# Patient Record
Sex: Female | Born: 1945 | Race: White | Hispanic: No | Marital: Single | Smoking: Former smoker
Health system: Southern US, Community
[De-identification: ages and names within clinical notes are randomized; demographics above are authoritative.]

## PROBLEM LIST (undated history)

## (undated) DIAGNOSIS — R600 Localized edema: Secondary | ICD-10-CM

## (undated) DIAGNOSIS — I10 Essential (primary) hypertension: Secondary | ICD-10-CM

## (undated) DIAGNOSIS — M2550 Pain in unspecified joint: Secondary | ICD-10-CM

## (undated) DIAGNOSIS — E785 Hyperlipidemia, unspecified: Secondary | ICD-10-CM

## (undated) DIAGNOSIS — E039 Hypothyroidism, unspecified: Secondary | ICD-10-CM

## (undated) DIAGNOSIS — IMO0002 Reserved for concepts with insufficient information to code with codable children: Secondary | ICD-10-CM

## (undated) DIAGNOSIS — D649 Anemia, unspecified: Secondary | ICD-10-CM

## (undated) DIAGNOSIS — E1165 Type 2 diabetes mellitus with hyperglycemia: Secondary | ICD-10-CM

## (undated) DIAGNOSIS — G5700 Lesion of sciatic nerve, unspecified lower limb: Secondary | ICD-10-CM

## (undated) DIAGNOSIS — R0602 Shortness of breath: Secondary | ICD-10-CM

## (undated) DIAGNOSIS — C9110 Chronic lymphocytic leukemia of B-cell type not having achieved remission: Secondary | ICD-10-CM

## (undated) DIAGNOSIS — E559 Vitamin D deficiency, unspecified: Secondary | ICD-10-CM

## (undated) DIAGNOSIS — M4306 Spondylolysis, lumbar region: Secondary | ICD-10-CM

## (undated) DIAGNOSIS — M549 Dorsalgia, unspecified: Secondary | ICD-10-CM

## (undated) DIAGNOSIS — E669 Obesity, unspecified: Secondary | ICD-10-CM

## (undated) HISTORY — DX: Vitamin D deficiency, unspecified: E55.9

## (undated) HISTORY — DX: Anemia, unspecified: D64.9

## (undated) HISTORY — DX: Localized edema: R60.0

## (undated) HISTORY — PX: KNEE SURGERY: SHX244

## (undated) HISTORY — DX: Lesion of sciatic nerve, unspecified lower limb: G57.00

## (undated) HISTORY — DX: Reserved for concepts with insufficient information to code with codable children: IMO0002

## (undated) HISTORY — PX: TONSILLECTOMY AND ADENOIDECTOMY: SUR1326

## (undated) HISTORY — DX: Essential (primary) hypertension: I10

## (undated) HISTORY — DX: Dorsalgia, unspecified: M54.9

## (undated) HISTORY — DX: Pain in unspecified joint: M25.50

## (undated) HISTORY — DX: Shortness of breath: R06.02

## (undated) HISTORY — DX: Chronic lymphocytic leukemia of B-cell type not having achieved remission: C91.10

## (undated) HISTORY — DX: Spondylolysis, lumbar region: M43.06

## (undated) HISTORY — PX: ELBOW SURGERY: SHX618

## (undated) HISTORY — DX: Obesity, unspecified: E66.9

## (undated) HISTORY — DX: Hypothyroidism, unspecified: E03.9

## (undated) HISTORY — DX: Hyperlipidemia, unspecified: E78.5

## (undated) HISTORY — DX: Type 2 diabetes mellitus with hyperglycemia: E11.65

---

## 1998-02-24 ENCOUNTER — Other Ambulatory Visit: Admission: RE | Admit: 1998-02-24 | Discharge: 1998-02-24 | Payer: Self-pay | Admitting: Obstetrics and Gynecology

## 1998-03-02 ENCOUNTER — Other Ambulatory Visit: Admission: RE | Admit: 1998-03-02 | Discharge: 1998-03-02 | Payer: Self-pay | Admitting: Obstetrics and Gynecology

## 1999-05-20 ENCOUNTER — Other Ambulatory Visit: Admission: RE | Admit: 1999-05-20 | Discharge: 1999-05-20 | Payer: Self-pay | Admitting: Gynecology

## 2000-07-27 ENCOUNTER — Other Ambulatory Visit: Admission: RE | Admit: 2000-07-27 | Discharge: 2000-07-27 | Payer: Self-pay | Admitting: Gynecology

## 2001-09-10 ENCOUNTER — Other Ambulatory Visit: Admission: RE | Admit: 2001-09-10 | Discharge: 2001-09-10 | Payer: Self-pay | Admitting: Gynecology

## 2002-01-03 ENCOUNTER — Other Ambulatory Visit: Admission: RE | Admit: 2002-01-03 | Discharge: 2002-01-03 | Payer: Self-pay | Admitting: Obstetrics and Gynecology

## 2002-01-10 ENCOUNTER — Emergency Department (HOSPITAL_COMMUNITY): Admission: EM | Admit: 2002-01-10 | Discharge: 2002-01-10 | Payer: Self-pay

## 2002-07-16 ENCOUNTER — Encounter: Admission: RE | Admit: 2002-07-16 | Discharge: 2002-10-14 | Payer: Self-pay | Admitting: Family Medicine

## 2003-04-30 ENCOUNTER — Other Ambulatory Visit: Admission: RE | Admit: 2003-04-30 | Discharge: 2003-04-30 | Payer: Self-pay | Admitting: Gynecology

## 2013-07-07 ENCOUNTER — Other Ambulatory Visit (HOSPITAL_COMMUNITY)
Admission: RE | Admit: 2013-07-07 | Discharge: 2013-07-07 | Disposition: A | Discharge status: Home / Self Care | Payer: 59 | Source: Ambulatory Visit | Attending: Family Medicine | Admitting: Family Medicine

## 2013-07-07 ENCOUNTER — Other Ambulatory Visit: Payer: Self-pay | Admitting: Family Medicine

## 2013-07-07 DIAGNOSIS — Z01419 Encounter for gynecological examination (general) (routine) without abnormal findings: Secondary | ICD-10-CM | POA: Insufficient documentation

## 2014-08-13 ENCOUNTER — Other Ambulatory Visit: Payer: Self-pay | Admitting: Gastroenterology

## 2015-01-20 DIAGNOSIS — N63 Unspecified lump in breast: Secondary | ICD-10-CM | POA: Diagnosis not present

## 2015-05-11 DIAGNOSIS — I10 Essential (primary) hypertension: Secondary | ICD-10-CM | POA: Diagnosis not present

## 2015-05-11 DIAGNOSIS — E1165 Type 2 diabetes mellitus with hyperglycemia: Secondary | ICD-10-CM | POA: Diagnosis not present

## 2015-05-11 DIAGNOSIS — Z794 Long term (current) use of insulin: Secondary | ICD-10-CM | POA: Diagnosis not present

## 2015-05-11 DIAGNOSIS — E559 Vitamin D deficiency, unspecified: Secondary | ICD-10-CM | POA: Diagnosis not present

## 2015-05-11 DIAGNOSIS — E782 Mixed hyperlipidemia: Secondary | ICD-10-CM | POA: Diagnosis not present

## 2015-05-14 DIAGNOSIS — L719 Rosacea, unspecified: Secondary | ICD-10-CM | POA: Diagnosis not present

## 2015-05-14 DIAGNOSIS — J309 Allergic rhinitis, unspecified: Secondary | ICD-10-CM | POA: Diagnosis not present

## 2015-05-14 DIAGNOSIS — E559 Vitamin D deficiency, unspecified: Secondary | ICD-10-CM | POA: Diagnosis not present

## 2015-05-14 DIAGNOSIS — E782 Mixed hyperlipidemia: Secondary | ICD-10-CM | POA: Diagnosis not present

## 2015-05-14 DIAGNOSIS — D649 Anemia, unspecified: Secondary | ICD-10-CM | POA: Diagnosis not present

## 2015-05-14 DIAGNOSIS — G479 Sleep disorder, unspecified: Secondary | ICD-10-CM | POA: Diagnosis not present

## 2015-05-14 DIAGNOSIS — M5431 Sciatica, right side: Secondary | ICD-10-CM | POA: Diagnosis not present

## 2015-05-14 DIAGNOSIS — I10 Essential (primary) hypertension: Secondary | ICD-10-CM | POA: Diagnosis not present

## 2015-05-14 DIAGNOSIS — E1165 Type 2 diabetes mellitus with hyperglycemia: Secondary | ICD-10-CM | POA: Diagnosis not present

## 2016-05-26 DIAGNOSIS — D649 Anemia, unspecified: Secondary | ICD-10-CM | POA: Diagnosis not present

## 2016-05-26 DIAGNOSIS — E782 Mixed hyperlipidemia: Secondary | ICD-10-CM | POA: Diagnosis not present

## 2016-05-26 DIAGNOSIS — Z794 Long term (current) use of insulin: Secondary | ICD-10-CM | POA: Diagnosis not present

## 2016-05-26 DIAGNOSIS — I10 Essential (primary) hypertension: Secondary | ICD-10-CM | POA: Diagnosis not present

## 2016-05-26 DIAGNOSIS — E559 Vitamin D deficiency, unspecified: Secondary | ICD-10-CM | POA: Diagnosis not present

## 2016-05-26 DIAGNOSIS — E1165 Type 2 diabetes mellitus with hyperglycemia: Secondary | ICD-10-CM | POA: Diagnosis not present

## 2016-05-26 DIAGNOSIS — Z7984 Long term (current) use of oral hypoglycemic drugs: Secondary | ICD-10-CM | POA: Diagnosis not present

## 2016-05-31 DIAGNOSIS — I10 Essential (primary) hypertension: Secondary | ICD-10-CM | POA: Diagnosis not present

## 2016-05-31 DIAGNOSIS — M542 Cervicalgia: Secondary | ICD-10-CM | POA: Diagnosis not present

## 2016-05-31 DIAGNOSIS — R0609 Other forms of dyspnea: Secondary | ICD-10-CM | POA: Diagnosis not present

## 2016-05-31 DIAGNOSIS — Z6838 Body mass index (BMI) 38.0-38.9, adult: Secondary | ICD-10-CM | POA: Diagnosis not present

## 2016-05-31 DIAGNOSIS — R000 Tachycardia, unspecified: Secondary | ICD-10-CM | POA: Diagnosis not present

## 2016-05-31 DIAGNOSIS — E782 Mixed hyperlipidemia: Secondary | ICD-10-CM | POA: Diagnosis not present

## 2016-05-31 DIAGNOSIS — Z794 Long term (current) use of insulin: Secondary | ICD-10-CM | POA: Diagnosis not present

## 2016-05-31 DIAGNOSIS — E1165 Type 2 diabetes mellitus with hyperglycemia: Secondary | ICD-10-CM | POA: Diagnosis not present

## 2016-05-31 DIAGNOSIS — Z7984 Long term (current) use of oral hypoglycemic drugs: Secondary | ICD-10-CM | POA: Diagnosis not present

## 2016-06-16 ENCOUNTER — Encounter: Payer: Self-pay | Admitting: Cardiology

## 2016-06-21 ENCOUNTER — Encounter: Payer: Self-pay | Admitting: Cardiology

## 2016-06-27 DIAGNOSIS — M5412 Radiculopathy, cervical region: Secondary | ICD-10-CM | POA: Diagnosis not present

## 2016-06-30 ENCOUNTER — Encounter: Payer: Self-pay | Admitting: Physician Assistant

## 2016-07-12 DIAGNOSIS — M5412 Radiculopathy, cervical region: Secondary | ICD-10-CM | POA: Diagnosis not present

## 2016-07-14 ENCOUNTER — Ambulatory Visit: Payer: Self-pay | Admitting: Cardiology

## 2016-07-18 ENCOUNTER — Ambulatory Visit: Payer: Self-pay | Admitting: Physician Assistant

## 2016-08-02 ENCOUNTER — Ambulatory Visit (INDEPENDENT_AMBULATORY_CARE_PROVIDER_SITE_OTHER): Payer: Medicare Other | Admitting: Cardiovascular Disease

## 2016-08-02 ENCOUNTER — Encounter: Payer: Self-pay | Admitting: Cardiovascular Disease

## 2016-08-02 ENCOUNTER — Encounter (INDEPENDENT_AMBULATORY_CARE_PROVIDER_SITE_OTHER): Payer: Self-pay

## 2016-08-02 VITALS — BP 138/90 | HR 101 | Ht 63.5 in | Wt 223.2 lb

## 2016-08-02 DIAGNOSIS — R000 Tachycardia, unspecified: Secondary | ICD-10-CM | POA: Diagnosis not present

## 2016-08-02 DIAGNOSIS — E785 Hyperlipidemia, unspecified: Secondary | ICD-10-CM | POA: Diagnosis not present

## 2016-08-02 DIAGNOSIS — R0609 Other forms of dyspnea: Secondary | ICD-10-CM

## 2016-08-02 DIAGNOSIS — R0600 Dyspnea, unspecified: Secondary | ICD-10-CM

## 2016-08-02 MED ORDER — CARVEDILOL 3.125 MG PO TABS: 3.1250 mg | ORAL_TABLET | Freq: Two times a day (BID) | ORAL | 11 refills | Status: DC

## 2016-08-02 NOTE — Progress Notes (Signed)
Cardiology Office Note   Date:  08/02/2016   ID:  Cassandra Palmer, DOB 11-25-1945, MRN [id number]  PCP:  Harle Battiest, MD  Cardiologist:   Mertie Moores, MD   Chief Complaint  Patient presents with  . Shortness of Breath  . Tachycardia   Problem List 1. Dyspnea with exertion 2. Tachycardia  3 hyperlipidemia  4. Obstructive  sleep apnea  5. Diabetes Mellitus   History of Present Illness: Cassandra Palmer is a 70 y.o. female who presents for evaluation of dyspnea and tachycardia    Has noticed DOE for the past 6 - 12 months .   Gets short of breath doing her regular household chores.  No DOE with her water aerobic.   No CP or tightness.   Never has doe at rest .  Has also noticed that her HR is too high. Cant feel paliptations but notices thather HR is high when she takes her BP readings  Has been on Lisinopril  Has not been tried on beta blocker   Diabetes is poorly controlled - HbA1C was 9 this year Was put on Invocana  Admits that she does not follow a good diet, has been to nutritionist.    Does water aerobics , is able to walk some but has limited walking ability due to cysts in her back.   Non smoker, ETOH rarely  Retired as a Tree surgeon for hair care products.     Past Medical History:  Diagnosis Date  . Diabetes mellitus type 2, uncontrolled ([place])   . Hyperlipidemia   . Hypertension     Past Surgical History:  Procedure Laterality Date  . ELBOW SURGERY    . KNEE SURGERY       Current Outpatient Prescriptions  Medication Sig Dispense Refill  . aspirin EC 81 MG tablet Take 81 mg by mouth daily.    . canagliflozin (INVOKANA) 300 MG TABS tablet Take 300 mg by mouth daily before breakfast.    . celecoxib (CELEBREX) 200 MG capsule Take 200 mg by mouth daily.    . cyclobenzaprine (FLEXERIL) 10 MG tablet Take 10 mg by mouth daily as needed for muscle spasms.    Marland Kitchen ezetimibe (ZETIA) 10 MG tablet Take 10 mg by mouth daily.    . metFORMIN  (GLUCOPHAGE) 500 MG tablet Take 500 mg by mouth 4 (four) times daily.    . metroNIDAZOLE (METROCREAM) 0.75 % cream Apply 1 application topically as directed.    . niacin (NIASPAN) 1000 MG CR tablet Take 2,000 mg by mouth at bedtime.    . simvastatin (ZOCOR) 40 MG tablet Take 40 mg by mouth daily.    . traZODone (DESYREL) 100 MG tablet Take 100 mg by mouth at bedtime as needed for sleep.    . carvedilol (COREG) 3.125 MG tablet Take 1 tablet (3.125 mg total) by mouth 2 (two) times daily. 60 tablet 11   No current facility-administered medications for this visit.     Allergies:   Actos [pioglitazone]    Social History:  The patient  reports that she has quit smoking. She has never used smokeless tobacco. She reports that she drinks about 0.6 - 1.2 oz of alcohol per week . She reports that she does not use drugs.   Family History:  The patient's family history includes Congestive Heart Failure in her mother; Diabetes in her father; Heart Problems in her father; Hypertension in her mother.    ROS:  Please see the history of present illness.  Review of Systems: Constitutional:  denies fever, chills, diaphoresis, appetite change and fatigue.  HEENT: denies photophobia, eye pain, redness, hearing loss, ear pain, congestion, sore throat, rhinorrhea, sneezing, neck pain, neck stiffness and tinnitus.  Respiratory: denies SOB, DOE,  Cardiovascular: denies chest pain, palpitations and leg swelling.  Gastrointestinal: denies nausea, vomiting, abdominal pain, diarrhea, constipation, blood in stool.  Genitourinary: denies dysuria, urgency, frequency, hematuria, flank pain and difficulty urinating.  Musculoskeletal: denies  myalgias, back pain, joint swelling, arthralgias and gait problem.   Skin: denies pallor, rash and wound.  Neurological: denies dizziness, seizures, syncope, weakness, light-headedness, numbness and headaches.   Hematological: denies adenopathy, easy bruising, personal or family  bleeding history.  Psychiatric/ Behavioral: denies suicidal ideation, mood changes, confusion, nervousness, sleep disturbance and agitation.       All other systems are reviewed and negative.    PHYSICAL EXAM: VS:  BP 138/90   Pulse (!) 101   Ht 5' 3.5" (1.613 m)   Wt 223 lb 3.2 oz (101.2 kg)   BMI 38.92 kg/m  , BMI Body mass index is 38.92 kg/m. GEN: Well nourished, well developed, in no acute distress  HEENT: normal  Neck: no JVD, carotid bruits, or masses Cardiac: RRR; no murmurs, rubs, or gallops,no edema  Respiratory:  clear to auscultation bilaterally, normal work of breathing GI: soft, nontender, nondistended, + BS MS: no deformity or atrophy  Skin: warm and dry, no rash Neuro:  Strength and sensation are intact Psych: normal   EKG:  EKG is ordered today. The ekg ordered today demonstrates sinus tach at 101.  Poor R wave progression    Recent Labs: No results found for requested labs within last 8760 hours.    Lipid Panel No results found for: CHOL, TRIG, HDL, CHOLHDL, VLDL, LDLCALC, LDLDIRECT    Wt Readings from Last 3 Encounters:  08/02/16 223 lb 3.2 oz (101.2 kg)      Other studies Reviewed: Additional studies/ records that were reviewed today include: . Review of the above records demonstrates:    ASSESSMENT AND PLAN:  1.  Dyspnea:   The patient presents with shortness of breath with exertion. She does not get a lot of exercise. We'll get an echo card gram for further evaluation of her LV function.  She works out in [employer] most of the time because she has chronic back pain. I've encouraged her to try to an exercise bike I've encouraged her to continue with her weight loss program.  2. Tachycardia: We'll stop the lisinopril. We'll start her on carvedilol 3.125 mg twice a day. We'll get the echocardiogram as noted above. I'll see her back in 3 months.  Current medicines are reviewed at length with the patient today.  The patient does not have  concerns regarding medicines.  Labs/ tests ordered today include:   Orders Placed This Encounter  Procedures  . EKG 12-Lead  . ECHOCARDIOGRAM COMPLETE     Disposition:   FU with me in 3 months      Mertie Moores, MD  08/02/2016 6:15 PM    [facility] [address], [place], [place]  [address] Phone: [phone]; Fax: [phone]

## 2016-08-02 NOTE — Patient Instructions (Signed)
Medication Instructions:  STOP Lisinopril START Carvedilol (Coreg) 3.125 mg twice daily - take 12 hours apart   Labwork: None Ordered   Testing/Procedures: Your physician has requested that you have an echocardiogram. Echocardiography is a painless test that uses sound waves to create images of your heart. It provides your doctor with information about the size and shape of your heart and how well your heart's chambers and valves are working. This procedure takes approximately one hour. There are no restrictions for this procedure.   Follow-Up: Your physician recommends that you schedule a follow-up appointment in: 3 months with Dr. Acie Fredrickson.    If you need a refill on your cardiac medications before your next appointment, please call your pharmacy.   Thank you for choosing [facility]! Christen Bame, RN [phone]

## 2016-08-10 DIAGNOSIS — N6320 Unspecified lump in the left breast, unspecified quadrant: Secondary | ICD-10-CM | POA: Diagnosis not present

## 2016-08-16 ENCOUNTER — Ambulatory Visit (HOSPITAL_COMMUNITY): Payer: Medicare Other | Attending: Cardiology

## 2016-08-16 ENCOUNTER — Other Ambulatory Visit: Payer: Self-pay

## 2016-08-16 DIAGNOSIS — R0609 Other forms of dyspnea: Secondary | ICD-10-CM | POA: Diagnosis not present

## 2016-08-16 DIAGNOSIS — I501 Left ventricular failure: Secondary | ICD-10-CM | POA: Diagnosis not present

## 2016-08-16 DIAGNOSIS — R000 Tachycardia, unspecified: Secondary | ICD-10-CM | POA: Diagnosis not present

## 2016-08-16 DIAGNOSIS — R0600 Dyspnea, unspecified: Secondary | ICD-10-CM

## 2016-08-17 DIAGNOSIS — E1165 Type 2 diabetes mellitus with hyperglycemia: Secondary | ICD-10-CM | POA: Diagnosis not present

## 2016-08-17 DIAGNOSIS — Z7984 Long term (current) use of oral hypoglycemic drugs: Secondary | ICD-10-CM | POA: Diagnosis not present

## 2016-08-17 DIAGNOSIS — Z23 Encounter for immunization: Secondary | ICD-10-CM | POA: Diagnosis not present

## 2016-08-17 DIAGNOSIS — E782 Mixed hyperlipidemia: Secondary | ICD-10-CM | POA: Diagnosis not present

## 2016-10-13 ENCOUNTER — Encounter: Payer: Self-pay | Admitting: Cardiovascular Disease

## 2016-10-13 ENCOUNTER — Ambulatory Visit (INDEPENDENT_AMBULATORY_CARE_PROVIDER_SITE_OTHER): Payer: Medicare Other | Admitting: Cardiovascular Disease

## 2016-10-13 VITALS — BP 122/80 | HR 84 | Ht 63.5 in | Wt 227.0 lb

## 2016-10-13 DIAGNOSIS — R000 Tachycardia, unspecified: Secondary | ICD-10-CM | POA: Diagnosis not present

## 2016-10-13 MED ORDER — CARVEDILOL 3.125 MG PO TABS: 3.1250 mg | ORAL_TABLET | Freq: Two times a day (BID) | ORAL | 3 refills | Status: AC

## 2016-10-13 NOTE — Progress Notes (Signed)
Cardiology Office Note   Date:  10/13/2016   ID:  Cassandra Palmer, DOB [birth year], MRN [id number]  PCP:  Melinda Crutch, MD  Cardiologist:   Mertie Moores, MD   Chief Complaint  Patient presents with  . Follow-up    dyspnea    Problem List 1. Dyspnea with exertion 2. Tachycardia  3 hyperlipidemia  4. Obstructive  sleep apnea  5. Diabetes Mellitus   History of Present Illness: Cassandra Palmer is a 70 y.o. female who presents for evaluation of dyspnea and tachycardia    Has noticed DOE for the past 6 - 12 months .   Gets short of breath doing her regular household chores.  No DOE with her water aerobic.   No CP or tightness.   Never has doe at rest .  Has also noticed that her HR is too high. Cant feel paliptations but notices thather HR is high when she takes her BP readings  Has been on Lisinopril  Has not been tried on beta blocker   Diabetes is poorly controlled - HbA1C was 9 this year Was put on Invocana  Admits that she does not follow a good diet, has been to nutritionist.    Does water aerobics , is able to walk some but has limited walking ability due to cysts in her back.   Non smoker, ETOH rarely  Retired as a Tree surgeon for hair care products.   Dec. 15, 2017:  Cassandra Palmer is seen today for follow-up of her tachycardia. Does water aerobic 6 days a week.   Past Medical History:  Diagnosis Date  . Diabetes mellitus type 2, uncontrolled ([place])   . Hyperlipidemia   . Hypertension     Past Surgical History:  Procedure Laterality Date  . ELBOW SURGERY    . KNEE SURGERY       Current Outpatient Prescriptions  Medication Sig Dispense Refill  . aspirin EC 81 MG tablet Take 81 mg by mouth daily.    . canagliflozin (INVOKANA) 300 MG TABS tablet Take 300 mg by mouth daily before breakfast.    . carvedilol (COREG) 3.125 MG tablet Take 1 tablet (3.125 mg total) by mouth 2 (two) times daily. 60 tablet 11  . celecoxib (CELEBREX) 200 MG capsule  Take 200 mg by mouth daily.    . cyclobenzaprine (FLEXERIL) 10 MG tablet Take 10 mg by mouth daily as needed for muscle spasms.    Marland Kitchen ezetimibe (ZETIA) 10 MG tablet Take 10 mg by mouth daily.    . metFORMIN (GLUCOPHAGE) 500 MG tablet Take 500 mg by mouth 4 (four) times daily.    . metroNIDAZOLE (METROCREAM) 0.75 % cream Apply 1 application topically as directed.    . niacin (NIASPAN) 1000 MG CR tablet Take 2,000 mg by mouth at bedtime.    . simvastatin (ZOCOR) 40 MG tablet Take 40 mg by mouth daily.    . traZODone (DESYREL) 100 MG tablet Take 100 mg by mouth at bedtime as needed for sleep.     No current facility-administered medications for this visit.     Allergies:   Actos [pioglitazone] and Other    Social History:  The patient  reports that she has quit smoking. She has never used smokeless tobacco. She reports that she drinks about 0.6 - 1.2 oz of alcohol per week . She reports that she does not use drugs.   Family History:  The patient's family history includes Congestive Heart Failure in her mother; Diabetes in her  father; Heart Problems in her father; Hypertension in her mother.    ROS:  Please see the history of present illness.    Review of Systems: Constitutional:  denies fever, chills, diaphoresis, appetite change and fatigue.  HEENT: denies photophobia, eye pain, redness, hearing loss, ear pain, congestion, sore throat, rhinorrhea, sneezing, neck pain, neck stiffness and tinnitus.  Respiratory: denies SOB, DOE,  Cardiovascular: denies chest pain, palpitations and leg swelling.  Gastrointestinal: denies nausea, vomiting, abdominal pain, diarrhea, constipation, blood in stool.  Genitourinary: denies dysuria, urgency, frequency, hematuria, flank pain and difficulty urinating.  Musculoskeletal: denies  myalgias, back pain, joint swelling, arthralgias and gait problem.   Skin: denies pallor, rash and wound.  Neurological: denies dizziness, seizures, syncope, weakness,  light-headedness, numbness and headaches.   Hematological: denies adenopathy, easy bruising, personal or family bleeding history.  Psychiatric/ Behavioral: denies suicidal ideation, mood changes, confusion, nervousness, sleep disturbance and agitation.       All other systems are reviewed and negative.    PHYSICAL EXAM: VS:  BP 122/80 (BP Location: Left Arm, Patient Position: Sitting, Cuff Size: Normal)   Pulse 84   Ht 5' 3.5" (1.613 m)   Wt 227 lb (103 kg)   BMI 39.58 kg/m  , BMI Body mass index is 39.58 kg/m. GEN: Well nourished, well developed, in no acute distress  HEENT: normal  Neck: no JVD, carotid bruits, or masses Cardiac: RRR; no murmurs, rubs, or gallops,no edema  Respiratory:  clear to auscultation bilaterally, normal work of breathing GI: soft, nontender, nondistended, + BS MS: no deformity or atrophy  Skin: warm and dry, no rash Neuro:  Strength and sensation are intact Psych: normal   EKG:  EKG is ordered today. The ekg ordered today demonstrates sinus tach at 101.  Poor R wave progression    Recent Labs: No results found for requested labs within last 8760 hours.    Lipid Panel No results found for: CHOL, TRIG, HDL, CHOLHDL, VLDL, LDLCALC, LDLDIRECT    Wt Readings from Last 3 Encounters:  10/13/16 227 lb (103 kg)  08/02/16 223 lb 3.2 oz (101.2 kg)      Other studies Reviewed: Additional studies/ records that were reviewed today include: . Review of the above records demonstrates:    ASSESSMENT AND PLAN:  1.  Dyspnea:   The patient presents with shortness of breath with exertion. She does not get a lot of exercise. We'll get an echocardiogram for further evaluation of her LV function.  She works out in [employer] most of the time because she has chronic back pain. I've encouraged her to try to an exercise bike I've encouraged her to continue with her weight loss program.  2. Tachycardia: She is doing much better on the carvedilol and off the  lisinopril. Continue current medications.  Current medicines are reviewed at length with the patient today.  The patient does not have concerns regarding medicines.  Labs/ tests ordered today include:   No orders of the defined types were placed in this encounter.   Disposition:   FU with me as needed.    Mertie Moores, MD  10/13/2016 11:32 AM    [facility] [address], [place], [place]  [address] Phone: [phone]; Fax: [phone]

## 2016-10-13 NOTE — Patient Instructions (Signed)
Medication Instructions:  Your physician recommends that you continue on your current medications as directed. Please refer to the Current Medication list given to you today.   Labwork: None Ordered   Testing/Procedures: None Ordered   Follow-Up: Your physician recommends that you schedule a follow-up appointment in: as needed with Dr. Nahser   If you need a refill on your cardiac medications before your next appointment, please call your pharmacy.   Thank you for choosing [facility]! [place], RN [phone]    

## 2016-11-10 ENCOUNTER — Ambulatory Visit: Payer: Medicare Other | Admitting: Cardiovascular Disease

## 2016-11-29 DIAGNOSIS — E1165 Type 2 diabetes mellitus with hyperglycemia: Secondary | ICD-10-CM | POA: Diagnosis not present

## 2016-11-29 DIAGNOSIS — Z794 Long term (current) use of insulin: Secondary | ICD-10-CM | POA: Diagnosis not present

## 2016-12-04 DIAGNOSIS — L719 Rosacea, unspecified: Secondary | ICD-10-CM | POA: Diagnosis not present

## 2016-12-04 DIAGNOSIS — M6740 Ganglion, unspecified site: Secondary | ICD-10-CM | POA: Diagnosis not present

## 2016-12-04 DIAGNOSIS — B379 Candidiasis, unspecified: Secondary | ICD-10-CM | POA: Diagnosis not present

## 2016-12-04 DIAGNOSIS — Z794 Long term (current) use of insulin: Secondary | ICD-10-CM | POA: Diagnosis not present

## 2016-12-04 DIAGNOSIS — Z7984 Long term (current) use of oral hypoglycemic drugs: Secondary | ICD-10-CM | POA: Diagnosis not present

## 2016-12-04 DIAGNOSIS — E1165 Type 2 diabetes mellitus with hyperglycemia: Secondary | ICD-10-CM | POA: Diagnosis not present

## 2016-12-04 DIAGNOSIS — G4700 Insomnia, unspecified: Secondary | ICD-10-CM | POA: Diagnosis not present

## 2016-12-04 DIAGNOSIS — E782 Mixed hyperlipidemia: Secondary | ICD-10-CM | POA: Diagnosis not present

## 2016-12-18 DIAGNOSIS — R69 Illness, unspecified: Secondary | ICD-10-CM | POA: Diagnosis not present

## 2017-01-10 DIAGNOSIS — R69 Illness, unspecified: Secondary | ICD-10-CM | POA: Diagnosis not present

## 2017-01-15 DIAGNOSIS — R69 Illness, unspecified: Secondary | ICD-10-CM | POA: Diagnosis not present

## 2017-02-19 DIAGNOSIS — R69 Illness, unspecified: Secondary | ICD-10-CM | POA: Diagnosis not present

## 2017-03-02 DIAGNOSIS — B379 Candidiasis, unspecified: Secondary | ICD-10-CM | POA: Diagnosis not present

## 2017-03-02 DIAGNOSIS — E1165 Type 2 diabetes mellitus with hyperglycemia: Secondary | ICD-10-CM | POA: Diagnosis not present

## 2017-03-02 DIAGNOSIS — E782 Mixed hyperlipidemia: Secondary | ICD-10-CM | POA: Diagnosis not present

## 2017-03-02 DIAGNOSIS — Z7984 Long term (current) use of oral hypoglycemic drugs: Secondary | ICD-10-CM | POA: Diagnosis not present

## 2017-03-02 DIAGNOSIS — Z794 Long term (current) use of insulin: Secondary | ICD-10-CM | POA: Diagnosis not present

## 2017-03-07 DIAGNOSIS — I10 Essential (primary) hypertension: Secondary | ICD-10-CM | POA: Diagnosis not present

## 2017-03-07 DIAGNOSIS — G4700 Insomnia, unspecified: Secondary | ICD-10-CM | POA: Diagnosis not present

## 2017-03-07 DIAGNOSIS — E1165 Type 2 diabetes mellitus with hyperglycemia: Secondary | ICD-10-CM | POA: Diagnosis not present

## 2017-03-07 DIAGNOSIS — E782 Mixed hyperlipidemia: Secondary | ICD-10-CM | POA: Diagnosis not present

## 2017-04-08 DIAGNOSIS — R69 Illness, unspecified: Secondary | ICD-10-CM | POA: Diagnosis not present

## 2017-04-27 DIAGNOSIS — M4316 Spondylolisthesis, lumbar region: Secondary | ICD-10-CM | POA: Diagnosis not present

## 2017-04-30 DIAGNOSIS — H5213 Myopia, bilateral: Secondary | ICD-10-CM | POA: Diagnosis not present

## 2017-04-30 DIAGNOSIS — Z794 Long term (current) use of insulin: Secondary | ICD-10-CM | POA: Diagnosis not present

## 2017-04-30 DIAGNOSIS — H2513 Age-related nuclear cataract, bilateral: Secondary | ICD-10-CM | POA: Diagnosis not present

## 2017-04-30 DIAGNOSIS — H524 Presbyopia: Secondary | ICD-10-CM | POA: Diagnosis not present

## 2017-04-30 DIAGNOSIS — Z0100 Encounter for examination of eyes and vision without abnormal findings: Secondary | ICD-10-CM | POA: Diagnosis not present

## 2017-04-30 DIAGNOSIS — E119 Type 2 diabetes mellitus without complications: Secondary | ICD-10-CM | POA: Diagnosis not present

## 2017-05-30 DIAGNOSIS — N62 Hypertrophy of breast: Secondary | ICD-10-CM | POA: Diagnosis not present

## 2017-06-12 DIAGNOSIS — M79645 Pain in left finger(s): Secondary | ICD-10-CM | POA: Diagnosis not present

## 2017-06-22 DIAGNOSIS — Z7984 Long term (current) use of oral hypoglycemic drugs: Secondary | ICD-10-CM | POA: Diagnosis not present

## 2017-06-22 DIAGNOSIS — D649 Anemia, unspecified: Secondary | ICD-10-CM | POA: Diagnosis not present

## 2017-06-22 DIAGNOSIS — E559 Vitamin D deficiency, unspecified: Secondary | ICD-10-CM | POA: Diagnosis not present

## 2017-06-22 DIAGNOSIS — I10 Essential (primary) hypertension: Secondary | ICD-10-CM | POA: Diagnosis not present

## 2017-06-22 DIAGNOSIS — E782 Mixed hyperlipidemia: Secondary | ICD-10-CM | POA: Diagnosis not present

## 2017-06-22 DIAGNOSIS — E1165 Type 2 diabetes mellitus with hyperglycemia: Secondary | ICD-10-CM | POA: Diagnosis not present

## 2017-06-26 DIAGNOSIS — G4700 Insomnia, unspecified: Secondary | ICD-10-CM | POA: Diagnosis not present

## 2017-06-26 DIAGNOSIS — J309 Allergic rhinitis, unspecified: Secondary | ICD-10-CM | POA: Diagnosis not present

## 2017-06-26 DIAGNOSIS — E559 Vitamin D deficiency, unspecified: Secondary | ICD-10-CM | POA: Diagnosis not present

## 2017-06-26 DIAGNOSIS — D649 Anemia, unspecified: Secondary | ICD-10-CM | POA: Diagnosis not present

## 2017-06-26 DIAGNOSIS — M549 Dorsalgia, unspecified: Secondary | ICD-10-CM | POA: Diagnosis not present

## 2017-06-26 DIAGNOSIS — E782 Mixed hyperlipidemia: Secondary | ICD-10-CM | POA: Diagnosis not present

## 2017-06-26 DIAGNOSIS — Z23 Encounter for immunization: Secondary | ICD-10-CM | POA: Diagnosis not present

## 2017-06-26 DIAGNOSIS — I10 Essential (primary) hypertension: Secondary | ICD-10-CM | POA: Diagnosis not present

## 2017-06-26 DIAGNOSIS — E1165 Type 2 diabetes mellitus with hyperglycemia: Secondary | ICD-10-CM | POA: Diagnosis not present

## 2017-06-26 DIAGNOSIS — Z0000 Encounter for general adult medical examination without abnormal findings: Secondary | ICD-10-CM | POA: Diagnosis not present

## 2017-07-04 ENCOUNTER — Other Ambulatory Visit: Payer: Self-pay | Admitting: Cardiovascular Disease

## 2017-07-05 NOTE — Telephone Encounter (Signed)
Medication Detail    Disp Refills Start End   carvedilol (COREG) 3.125 MG tablet 180 tablet 3 10/13/2016 01/11/2017   Sig - Route: Take 1 tablet (3.125 mg total) by mouth 2 (two) times daily. - Oral   Sent to pharmacy as: carvedilol (COREG) 3.125 MG tablet   E-Prescribing Status: Receipt confirmed by pharmacy (10/13/2016 11:49 AM EST)   Pharmacy   [id number] - [place], [address] AT [address]

## 2017-07-06 DIAGNOSIS — R69 Illness, unspecified: Secondary | ICD-10-CM | POA: Diagnosis not present

## 2017-07-30 DIAGNOSIS — R69 Illness, unspecified: Secondary | ICD-10-CM | POA: Diagnosis not present

## 2017-08-23 DIAGNOSIS — R69 Illness, unspecified: Secondary | ICD-10-CM | POA: Diagnosis not present

## 2017-09-28 DIAGNOSIS — Z1231 Encounter for screening mammogram for malignant neoplasm of breast: Secondary | ICD-10-CM | POA: Diagnosis not present

## 2017-10-15 DIAGNOSIS — R69 Illness, unspecified: Secondary | ICD-10-CM | POA: Diagnosis not present

## 2017-12-27 DIAGNOSIS — L820 Inflamed seborrheic keratosis: Secondary | ICD-10-CM | POA: Diagnosis not present

## 2017-12-27 DIAGNOSIS — G4700 Insomnia, unspecified: Secondary | ICD-10-CM | POA: Diagnosis not present

## 2017-12-27 DIAGNOSIS — D225 Melanocytic nevi of trunk: Secondary | ICD-10-CM | POA: Diagnosis not present

## 2017-12-27 DIAGNOSIS — Z794 Long term (current) use of insulin: Secondary | ICD-10-CM | POA: Diagnosis not present

## 2017-12-27 DIAGNOSIS — Z0000 Encounter for general adult medical examination without abnormal findings: Secondary | ICD-10-CM | POA: Diagnosis not present

## 2017-12-27 DIAGNOSIS — E1165 Type 2 diabetes mellitus with hyperglycemia: Secondary | ICD-10-CM | POA: Diagnosis not present

## 2017-12-27 DIAGNOSIS — E559 Vitamin D deficiency, unspecified: Secondary | ICD-10-CM | POA: Diagnosis not present

## 2017-12-27 DIAGNOSIS — L718 Other rosacea: Secondary | ICD-10-CM | POA: Diagnosis not present

## 2017-12-27 DIAGNOSIS — L821 Other seborrheic keratosis: Secondary | ICD-10-CM | POA: Diagnosis not present

## 2017-12-27 DIAGNOSIS — I10 Essential (primary) hypertension: Secondary | ICD-10-CM | POA: Diagnosis not present

## 2017-12-27 DIAGNOSIS — D649 Anemia, unspecified: Secondary | ICD-10-CM | POA: Diagnosis not present

## 2017-12-27 DIAGNOSIS — E782 Mixed hyperlipidemia: Secondary | ICD-10-CM | POA: Diagnosis not present

## 2018-01-01 DIAGNOSIS — E782 Mixed hyperlipidemia: Secondary | ICD-10-CM | POA: Diagnosis not present

## 2018-01-01 DIAGNOSIS — E1165 Type 2 diabetes mellitus with hyperglycemia: Secondary | ICD-10-CM | POA: Diagnosis not present

## 2018-01-01 DIAGNOSIS — D649 Anemia, unspecified: Secondary | ICD-10-CM | POA: Diagnosis not present

## 2018-01-01 DIAGNOSIS — E559 Vitamin D deficiency, unspecified: Secondary | ICD-10-CM | POA: Diagnosis not present

## 2018-01-01 DIAGNOSIS — I10 Essential (primary) hypertension: Secondary | ICD-10-CM | POA: Diagnosis not present

## 2018-01-01 DIAGNOSIS — Z794 Long term (current) use of insulin: Secondary | ICD-10-CM | POA: Diagnosis not present

## 2018-01-08 DIAGNOSIS — R69 Illness, unspecified: Secondary | ICD-10-CM | POA: Diagnosis not present

## 2018-01-30 DIAGNOSIS — M79672 Pain in left foot: Secondary | ICD-10-CM | POA: Diagnosis not present

## 2018-02-21 DIAGNOSIS — R69 Illness, unspecified: Secondary | ICD-10-CM | POA: Diagnosis not present

## 2018-04-17 DIAGNOSIS — R69 Illness, unspecified: Secondary | ICD-10-CM | POA: Diagnosis not present

## 2018-05-06 DIAGNOSIS — E119 Type 2 diabetes mellitus without complications: Secondary | ICD-10-CM | POA: Diagnosis not present

## 2018-05-06 DIAGNOSIS — H2513 Age-related nuclear cataract, bilateral: Secondary | ICD-10-CM | POA: Diagnosis not present

## 2018-05-06 DIAGNOSIS — Z794 Long term (current) use of insulin: Secondary | ICD-10-CM | POA: Diagnosis not present

## 2018-05-06 DIAGNOSIS — H5213 Myopia, bilateral: Secondary | ICD-10-CM | POA: Diagnosis not present

## 2018-05-06 DIAGNOSIS — H25013 Cortical age-related cataract, bilateral: Secondary | ICD-10-CM | POA: Diagnosis not present

## 2018-05-06 DIAGNOSIS — H52203 Unspecified astigmatism, bilateral: Secondary | ICD-10-CM | POA: Diagnosis not present

## 2018-05-06 DIAGNOSIS — H524 Presbyopia: Secondary | ICD-10-CM | POA: Diagnosis not present

## 2018-05-26 DIAGNOSIS — R69 Illness, unspecified: Secondary | ICD-10-CM | POA: Diagnosis not present

## 2018-07-03 DIAGNOSIS — E1165 Type 2 diabetes mellitus with hyperglycemia: Secondary | ICD-10-CM | POA: Diagnosis not present

## 2018-07-03 DIAGNOSIS — E782 Mixed hyperlipidemia: Secondary | ICD-10-CM | POA: Diagnosis not present

## 2018-07-03 DIAGNOSIS — D649 Anemia, unspecified: Secondary | ICD-10-CM | POA: Diagnosis not present

## 2018-07-03 DIAGNOSIS — I10 Essential (primary) hypertension: Secondary | ICD-10-CM | POA: Diagnosis not present

## 2018-07-03 DIAGNOSIS — E559 Vitamin D deficiency, unspecified: Secondary | ICD-10-CM | POA: Diagnosis not present

## 2018-07-11 DIAGNOSIS — M549 Dorsalgia, unspecified: Secondary | ICD-10-CM | POA: Diagnosis not present

## 2018-07-11 DIAGNOSIS — E782 Mixed hyperlipidemia: Secondary | ICD-10-CM | POA: Diagnosis not present

## 2018-07-11 DIAGNOSIS — R740 Nonspecific elevation of levels of transaminase and lactic acid dehydrogenase [LDH]: Secondary | ICD-10-CM | POA: Diagnosis not present

## 2018-07-11 DIAGNOSIS — E559 Vitamin D deficiency, unspecified: Secondary | ICD-10-CM | POA: Diagnosis not present

## 2018-07-11 DIAGNOSIS — Z0000 Encounter for general adult medical examination without abnormal findings: Secondary | ICD-10-CM | POA: Diagnosis not present

## 2018-07-11 DIAGNOSIS — G4700 Insomnia, unspecified: Secondary | ICD-10-CM | POA: Diagnosis not present

## 2018-07-11 DIAGNOSIS — I10 Essential (primary) hypertension: Secondary | ICD-10-CM | POA: Diagnosis not present

## 2018-07-11 DIAGNOSIS — Z23 Encounter for immunization: Secondary | ICD-10-CM | POA: Diagnosis not present

## 2018-07-11 DIAGNOSIS — E1169 Type 2 diabetes mellitus with other specified complication: Secondary | ICD-10-CM | POA: Diagnosis not present

## 2018-07-11 DIAGNOSIS — J309 Allergic rhinitis, unspecified: Secondary | ICD-10-CM | POA: Diagnosis not present

## 2018-07-17 ENCOUNTER — Encounter: Payer: Self-pay | Admitting: Hematology

## 2018-07-17 ENCOUNTER — Telehealth: Payer: Self-pay | Admitting: Hematology

## 2018-07-17 NOTE — Telephone Encounter (Signed)
New hematology referral received from Dr. Melinda Crutch for elevated wbc. Pt has been scheduled to see Dr. Irene Limbo on 10/7 at 1pm. Pt aware to arrive 30 minutes early. Letter mailed.

## 2018-08-02 NOTE — Progress Notes (Signed)
HEMATOLOGY/ONCOLOGY CONSULTATION NOTE  Date of Service: 08/05/2018  Patient Care Team: Lawerance Cruel, MD as PCP - General (Family Medicine)  CHIEF COMPLAINTS/PURPOSE OF CONSULTATION:  Leukocytosis   HISTORY OF PRESENTING ILLNESS:   Cassandra Palmer is a wonderful 72 y.o. female who has been referred to [place] by Dr. Lawerance Cruel for evaluation and management of Leukocytosis. The pt reports that she is doing well overall. She has had elevated WBC's for the past several years. She notes that she has a hx of elevated leukocytes.  She notes that her blood sugar levels were mildly elevated due to a recent change in her medications due to her insurance not covering these medications. She has been on Metformin for the past 5 years. She has been on Victoza for 1.5 years. She has her labs checked regularly by her PCP. She denies any recent viral infections or flu like symptoms. She denies any recent illnesses within the past 5 years or recent abx use. She has never been on steroids or prednisone. She is UTD with her mammograms and colonoscopy (last one within the last 3 years).   The pt reports associated fatigue, joint stiffness every morning that alleviates with movement, recurrent night sweats at 4-5 AM (clammy sensation). She takes vitamin D and she has had an elevated pulse rate that was evaluated with an echocardiogram with relief of her fatigue. She denies joint pain, abdominal pain, weight loss, mouth sores, and any other symptoms. She denies any other new symptoms in the past 6 months or year. She denies hx of rheumatological symptoms, thyroid issues, blood transfusions, tattoos. She stopped taking Invokana approximately 1-1.5 years ago due to recurrent yeast infections.   Most recent lab results (07/03/18) of CBC w/diff and CMP is as follows: all values are WNL except for WBC at 11.3k, MCV at 80.8, MCH at 26.0, Lymphs abs at 4.1k, Monocytes abs at 900, Glucose at 143.  On PMHx the pt  reports Vitamin D deficiency, Essential HTN, hyperlipidemia associated with DM Type 2, anemia, insomnia, Rosacea, seasonal allergies, and sciatic back pain radiating to right leg. On Social Hx the pt reports she used to smoke cigarettes in her 20's however, she hasn't smoked for the last 50 years. On Family Hx the pt reports Thyroid issues involving her mother and sister. Her mother had a hx of CLL. She notes rheumatic fever on her father's side.   Prior to retiring, she was a Tree surgeon for a [employer]   MEDICAL HISTORY:  Past Medical History:  Diagnosis Date  . Diabetes mellitus type 2, uncontrolled ([place])   . Hyperlipidemia   . Hypertension   . Sciatic nerve disease     SURGICAL HISTORY: Past Surgical History:  Procedure Laterality Date  . ELBOW SURGERY    . KNEE SURGERY      SOCIAL HISTORY: Social History   Socioeconomic History  . Marital status: Single    Spouse name: Not on file  . Number of children: 0  . Years of education: college  . Highest education level: Not on file  Occupational History  . Not on file  Social Needs  . Financial resource strain: Not on file  . Food insecurity:    Worry: Not on file    Inability: Not on file  . Transportation needs:    Medical: Not on file    Non-medical: Not on file  Tobacco Use  . Smoking status: Former Research scientist (life sciences)  . Smokeless tobacco:  Never Used  Substance and Sexual Activity  . Alcohol use: Yes    Alcohol/week: 1.0 - 2.0 standard drinks    Types: 1 - 2 Glasses of wine per week    Comment: Occasional  . Drug use: No  . Sexual activity: Not Currently  Lifestyle  . Physical activity:    Days per week: Not on file    Minutes per session: Not on file  . Stress: Not on file  Relationships  . Social connections:    Talks on phone: Not on file    Gets together: Not on file    Attends religious service: Not on file    Active member of club or organization: Not on file    Attends meetings of  clubs or organizations: Not on file    Relationship status: Not on file  . Intimate partner violence:    Fear of current or ex partner: Not on file    Emotionally abused: Not on file    Physically abused: Not on file    Forced sexual activity: Not on file  Other Topics Concern  . Not on file  Social History Narrative  . Not on file    FAMILY HISTORY: Family History  Problem Relation Age of Onset  . Congestive Heart Failure Mother   . Hypertension Mother   . Diabetes Father   . Heart Problems Father   . Hyperlipidemia Father   . Dementia Father   . Congestive Heart Failure Maternal Grandmother   . Heart attack Maternal Grandfather   . Heart disease Paternal Grandmother     ALLERGIES:  is allergic to invokana [canagliflozin] and actos [pioglitazone].  MEDICATIONS:  Current Outpatient Medications  Medication Sig Dispense Refill  . aspirin EC 81 MG tablet Take 81 mg by mouth daily.    . celecoxib (CELEBREX) 200 MG capsule Take 200 mg by mouth daily.    . cyclobenzaprine (FLEXERIL) 10 MG tablet Take 10 mg by mouth daily as needed for muscle spasms.    Marland Kitchen ezetimibe-simvastatin (VYTORIN) 10-40 MG tablet Take 1 tablet by mouth daily.    . insulin degludec (TRESIBA FLEXTOUCH) 100 UNIT/ML SOPN FlexTouch Pen Inject 30-40 Units into the skin daily.    Marland Kitchen liraglutide (VICTOZA) 18 MG/3ML SOPN Inject 1.8 mg into the skin daily.    . metFORMIN (GLUCOPHAGE) 1000 MG tablet Take 1,000 mg by mouth 2 (two) times daily with a meal.    . metroNIDAZOLE (METROCREAM) 0.75 % cream Apply 1 application topically as directed.    . niacin (NIASPAN) 1000 MG CR tablet Take 2,000 mg by mouth at bedtime.    . traZODone (DESYREL) 100 MG tablet Take 100 mg by mouth at bedtime as needed for sleep.    . carvedilol (COREG) 3.125 MG tablet Take 1 tablet (3.125 mg total) by mouth 2 (two) times daily. 180 tablet 3   No current facility-administered medications for this visit.     REVIEW OF SYSTEMS:    10 Point  review of Systems was done is negative except as noted above.  PHYSICAL EXAMINATION:  Vitals:   08/05/18 1250 08/05/18 1311  BP:  134/74  Pulse: 72 99  Resp: 19 16  Temp: 98 F (36.7 C) 98.3 F (36.8 C)  SpO2: 97% 96%   Filed Weights   08/05/18 1250 08/05/18 1311  Weight: 288 lb 3.2 oz (130.7 kg) 235 lb 4.8 oz (106.7 kg)   .Body mass index is 41.03 kg/m.  GENERAL:alert, in no acute distress  and comfortable SKIN: no acute rashes, no significant lesions EYES: conjunctiva are pink and non-injected, sclera anicteric OROPHARYNX: MMM, no exudates, no oropharyngeal erythema or ulceration NECK: supple, no JVD LYMPH:  no palpable lymphadenopathy in the cervical, axillary or inguinal regions LUNGS: clear to auscultation b/l with normal respiratory effort HEART: regular rate & rhythm ABDOMEN:  normoactive bowel sounds , non tender, not distended. Extremity: no pedal edema PSYCH: alert & oriented x 3 with fluent speech NEURO: no focal motor/sensory deficits  LABORATORY DATA:  I have reviewed the data as listed  . CBC Latest Ref Rng & Units 08/05/2018  WBC 3.9 - 10.3 K/uL 13.9(H)  Hemoglobin 11.6 - 15.9 g/dL 13.0  Hematocrit 34.8 - 46.6 % 41.0  Platelets 145 - 400 K/uL 278   . CBC    Component Value Date/Time   WBC 13.9 (H) 08/05/2018 1437   RBC 5.08 08/05/2018 1437   HGB 13.0 08/05/2018 1437   HCT 41.0 08/05/2018 1437   PLT 278 08/05/2018 1437   MCV 80.6 08/05/2018 1437   MCH 25.6 08/05/2018 1437   MCHC 31.7 08/05/2018 1437   RDW 15.6 (H) 08/05/2018 1437   LYMPHSABS 5.1 (H) 08/05/2018 1437   MONOABS 0.9 08/05/2018 1437   EOSABS 0.3 08/05/2018 1437   BASOSABS 0.1 08/05/2018 1437     . CMP Latest Ref Rng & Units 08/05/2018  Glucose 70 - 99 mg/dL 114(H)  BUN 8 - 23 mg/dL 21  Creatinine 0.44 - 1.00 mg/dL 0.99  Sodium 135 - 145 mmol/L 140  Potassium 3.5 - 5.1 mmol/L 4.2  Chloride 98 - 111 mmol/L 102  CO2 22 - 32 mmol/L 25  Calcium 8.9 - 10.3 mg/dL 10.1  Total  Protein 6.5 - 8.1 g/dL 7.6  Total Bilirubin 0.3 - 1.2 mg/dL 0.4  Alkaline Phos 38 - 126 U/L 75  AST 15 - 41 U/L 19  ALT 0 - 44 U/L 15   Component     Latest Ref Rng & Units 08/05/2018  Hep B Core Ab, Tot     Negative Negative  Hepatitis B Surface Ag     Negative Negative  HCV Ab     0.0 - 0.9 s/co ratio <0.1  LDH     98 - 192 U/L 175     07/03/18 CBC w/diff:    RADIOGRAPHIC STUDIES: I have personally reviewed the radiological images as listed and agreed with the findings in the report. No results found.  ASSESSMENT & PLAN:  72 y.o. female with  1. Leukocytosis - primarily lymphocytosis. PLAN -Discussed patient's most recent labs from 07/03/18, WBC at 11.3k, some mild lymphocytosis with Lymphs abs at 4.1k. Chemistries were all WNL except for glucose.  -reviewed labs from today - Hep B and Hep C serologies neg -Per patient records, she has had recurrent mildly elevated WBCs.  -Discussed with the patient regarding flow cytometry test to determine if there are clonal cells that would raise suspicious for lymphoproliferative disorder. -flow cytometry reviewed -- concerning for CD5+ clonal lymphoproliferative disorder -will need FISH for t(11;14) to r/o atypical Mantle cell lymphoma.  Plan:  Labs today RTC with Dr Irene Limbo in 2 weeks  All of the patients questions were answered with apparent satisfaction. The patient knows to call the clinic with any problems, questions or concerns.  The total time spent in the appt was 45 minutes and more than 50% was on counseling and direct patient cares.    Sullivan Lone MD [place] AAHIVMS [facility] Hematology/Oncology Physician  [place]  (Office):       [phone] (Work cell):  [phone] (Fax):           [phone]  08/05/2018 2:15 PM  I, Soijett Blue am acting as scribe for Dr. Sullivan Lone.  .I have reviewed the above documentation for accuracy and completeness, and I agree with the above. Brunetta Genera  MD

## 2018-08-05 ENCOUNTER — Inpatient Hospital Stay: Payer: Medicare HMO

## 2018-08-05 ENCOUNTER — Telehealth: Payer: Self-pay

## 2018-08-05 ENCOUNTER — Encounter: Payer: Self-pay | Admitting: Hematology

## 2018-08-05 ENCOUNTER — Inpatient Hospital Stay: Payer: Medicare HMO | Attending: Hematology | Admitting: Hematology

## 2018-08-05 VITALS — BP 134/74 | HR 99 | Temp 98.3°F | Resp 16 | Ht 63.5 in | Wt 235.3 lb

## 2018-08-05 DIAGNOSIS — Z806 Family history of leukemia: Secondary | ICD-10-CM | POA: Insufficient documentation

## 2018-08-05 DIAGNOSIS — Z7984 Long term (current) use of oral hypoglycemic drugs: Secondary | ICD-10-CM | POA: Diagnosis not present

## 2018-08-05 DIAGNOSIS — E559 Vitamin D deficiency, unspecified: Secondary | ICD-10-CM | POA: Diagnosis not present

## 2018-08-05 DIAGNOSIS — Z87891 Personal history of nicotine dependence: Secondary | ICD-10-CM | POA: Insufficient documentation

## 2018-08-05 DIAGNOSIS — E119 Type 2 diabetes mellitus without complications: Secondary | ICD-10-CM | POA: Diagnosis not present

## 2018-08-05 DIAGNOSIS — D649 Anemia, unspecified: Secondary | ICD-10-CM | POA: Diagnosis not present

## 2018-08-05 DIAGNOSIS — D72820 Lymphocytosis (symptomatic): Secondary | ICD-10-CM | POA: Diagnosis not present

## 2018-08-05 DIAGNOSIS — I10 Essential (primary) hypertension: Secondary | ICD-10-CM | POA: Diagnosis not present

## 2018-08-05 LAB — CBC WITH DIFFERENTIAL/PLATELET
Basophils Absolute: 0.1 10*3/uL (ref 0.0–0.1)
Basophils Relative: 1 %
EOS ABS: 0.3 10*3/uL (ref 0.0–0.5)
Eosinophils Relative: 2 %
HEMATOCRIT: 41 % (ref 34.8–46.6)
Hemoglobin: 13 g/dL (ref 11.6–15.9)
LYMPHS PCT: 37 %
Lymphs Abs: 5.1 10*3/uL — ABNORMAL HIGH (ref 0.9–3.3)
MCH: 25.6 pg (ref 25.1–34.0)
MCHC: 31.7 g/dL (ref 31.5–36.0)
MCV: 80.6 fL (ref 79.5–101.0)
MONOS PCT: 7 %
Monocytes Absolute: 0.9 10*3/uL (ref 0.1–0.9)
NEUTROS ABS: 7.5 10*3/uL — AB (ref 1.5–6.5)
NEUTROS PCT: 53 %
Platelets: 278 10*3/uL (ref 145–400)
RBC: 5.08 MIL/uL (ref 3.70–5.45)
RDW: 15.6 % — AB (ref 11.2–14.5)
WBC: 13.9 10*3/uL — ABNORMAL HIGH (ref 3.9–10.3)

## 2018-08-05 LAB — CMP (CANCER CENTER ONLY)
ALT: 15 U/L (ref 0–44)
AST: 19 U/L (ref 15–41)
Albumin: 4.4 g/dL (ref 3.5–5.0)
Alkaline Phosphatase: 75 U/L (ref 38–126)
Anion gap: 13 (ref 5–15)
BUN: 21 mg/dL (ref 8–23)
CHLORIDE: 102 mmol/L (ref 98–111)
CO2: 25 mmol/L (ref 22–32)
CREATININE: 0.99 mg/dL (ref 0.44–1.00)
Calcium: 10.1 mg/dL (ref 8.9–10.3)
GFR, Est AFR Am: >60 mL/min (ref >60)
GFR, Estimated: 57 mL/min — ABNORMAL LOW (ref >60)
Glucose, Bld: 114 mg/dL — ABNORMAL HIGH (ref 70–99)
POTASSIUM: 4.2 mmol/L (ref 3.5–5.1)
SODIUM: 140 mmol/L (ref 135–145)
Total Bilirubin: 0.4 mg/dL (ref 0.3–1.2)
Total Protein: 7.6 g/dL (ref 6.5–8.1)

## 2018-08-05 LAB — LACTATE DEHYDROGENASE: LDH: 175 U/L (ref 98–192)

## 2018-08-05 NOTE — Telephone Encounter (Signed)
Printed avs and calender of upcoming appointment. Per 10/7 los 

## 2018-08-05 NOTE — Progress Notes (Signed)
Influenza vaccine 07/11/18 with Dr. Lona Kettle with [facility] on [facility].

## 2018-08-06 LAB — HEPATITIS C ANTIBODY: HCV Ab: <0.1 s/co ratio (ref 0.0–0.9)

## 2018-08-06 LAB — HEPATITIS B CORE ANTIBODY, TOTAL: Hep B Core Total Ab: NEGATIVE

## 2018-08-06 LAB — HEPATITIS B SURFACE ANTIGEN: Hepatitis B Surface Ag: NEGATIVE

## 2018-08-13 ENCOUNTER — Telehealth: Payer: Self-pay | Admitting: Hematology

## 2018-08-13 LAB — FLOW CYTOMETRY

## 2018-08-13 NOTE — Telephone Encounter (Signed)
LMVM for patient with appt date/time per 10/15 sch msg

## 2018-08-20 ENCOUNTER — Telehealth: Payer: Self-pay

## 2018-08-20 NOTE — Telephone Encounter (Signed)
Patient called and requested to r/s appointment due to being out of town. Per 10/22 phone msg return call. New rescheduled will show up in her MyChart account. Patient is aware of the new date and time

## 2018-08-23 DIAGNOSIS — R69 Illness, unspecified: Secondary | ICD-10-CM | POA: Diagnosis not present

## 2018-08-26 ENCOUNTER — Ambulatory Visit: Payer: Medicare HMO | Admitting: Hematology

## 2018-08-28 NOTE — Progress Notes (Signed)
HEMATOLOGY/ONCOLOGY CONSULTATION NOTE  Date of Service: 08/29/2018  Patient Care Team: Lawerance Cruel, MD as PCP - General (Family Medicine)  CHIEF COMPLAINTS/PURPOSE OF CONSULTATION:  Leukocytosis   HISTORY OF PRESENTING ILLNESS:   Cassandra Palmer is a wonderful 72 y.o. female who has been referred to [place] by Dr. Lawerance Cruel for evaluation and management of Leukocytosis. The pt reports that she is doing well overall. She has had elevated WBC's for the past several years. She notes that she has a hx of elevated leukocytes.  She notes that her blood sugar levels were mildly elevated due to a recent change in her medications due to her insurance not covering these medications. She has been on Metformin for the past 5 years. She has been on Victoza for 1.5 years. She has her labs checked regularly by her PCP. She denies any recent viral infections or flu like symptoms. She denies any recent illnesses within the past 5 years or recent abx use. She has never been on steroids or prednisone. She is UTD with her mammograms and colonoscopy (last one within the last 3 years).   The pt reports associated fatigue, joint stiffness every morning that alleviates with movement, recurrent night sweats at 4-5 AM (clammy sensation). She takes vitamin D and she has had an elevated pulse rate that was evaluated with an echocardiogram with relief of her fatigue. She denies joint pain, abdominal pain, weight loss, mouth sores, and any other symptoms. She denies any other new symptoms in the past 6 months or year. She denies hx of rheumatological symptoms, thyroid issues, blood transfusions, tattoos. She stopped taking Invokana approximately 1-1.5 years ago due to recurrent yeast infections.   Most recent lab results (07/03/18) of CBC w/diff and CMP is as follows: all values are WNL except for WBC at 11.3k, MCV at 80.8, MCH at 26.0, Lymphs abs at 4.1k, Monocytes abs at 900, Glucose at 143.  On PMHx the  pt reports Vitamin D deficiency, Essential HTN, hyperlipidemia associated with DM Type 2, anemia, insomnia, Rosacea, seasonal allergies, and sciatic back pain radiating to right leg. On Social Hx the pt reports she used to smoke cigarettes in her 20's however, she hasn't smoked for the last 50 years. On Family Hx the pt reports Thyroid issues involving her mother and sister. Her mother had a hx of CLL. She notes rheumatic fever on her father's side.   Prior to retiring, she was a Tree surgeon for a [employer]   Interval History:   Cassandra Palmer returns today for management and evaluation of her Lymphocytosis. The patient's last visit with [place] was on 08/05/18. The pt reports that she is doing well overall.   The pt reports that she has not had any new concerns in the interim. The pt notes that she has not had any fevers, chills, night sweats or new fatigue. The pt denies noticing any new lumps or bumps.   Lab results (08/05/18) of CBC w/diff, CMP, and Reticulocytes is as follows: all values are WNL except for WBC at 13.9k, RDW at 15.6, ANC at 7.5k, Lymphs abs at 5.1k, Glucose at 114, GFR at 57. 08/05/18 LDH is WNL at 175  On review of systems, pt reports stable energy levels, and denies fevers, chills, night sweats, new fatigue, noticing any new lumps or bumps, back pains, and any other symptoms.    MEDICAL HISTORY:  Past Medical History:  Diagnosis Date  . Diabetes mellitus type 2, uncontrolled (  [place])   . Hyperlipidemia   . Hypertension   . Sciatic nerve disease     SURGICAL HISTORY: Past Surgical History:  Procedure Laterality Date  . ELBOW SURGERY    . KNEE SURGERY      SOCIAL HISTORY: Social History   Socioeconomic History  . Marital status: Single    Spouse name: Not on file  . Number of children: 0  . Years of education: college  . Highest education level: Not on file  Occupational History  . Not on file  Social Needs  . Financial resource  strain: Not on file  . Food insecurity:    Worry: Not on file    Inability: Not on file  . Transportation needs:    Medical: Not on file    Non-medical: Not on file  Tobacco Use  . Smoking status: Former Research scientist (life sciences)  . Smokeless tobacco: Never Used  Substance and Sexual Activity  . Alcohol use: Yes    Alcohol/week: 1.0 - 2.0 standard drinks    Types: 1 - 2 Glasses of wine per week    Comment: Occasional  . Drug use: No  . Sexual activity: Not Currently  Lifestyle  . Physical activity:    Days per week: Not on file    Minutes per session: Not on file  . Stress: Not on file  Relationships  . Social connections:    Talks on phone: Not on file    Gets together: Not on file    Attends religious service: Not on file    Active member of club or organization: Not on file    Attends meetings of clubs or organizations: Not on file    Relationship status: Not on file  . Intimate partner violence:    Fear of current or ex partner: Not on file    Emotionally abused: Not on file    Physically abused: Not on file    Forced sexual activity: Not on file  Other Topics Concern  . Not on file  Social History Narrative  . Not on file    FAMILY HISTORY: Family History  Problem Relation Age of Onset  . Congestive Heart Failure Mother   . Hypertension Mother   . Diabetes Father   . Heart Problems Father   . Hyperlipidemia Father   . Dementia Father   . Congestive Heart Failure Maternal Grandmother   . Heart attack Maternal Grandfather   . Heart disease Paternal Grandmother     ALLERGIES:  is allergic to invokana [canagliflozin] and actos [pioglitazone].  MEDICATIONS:  Current Outpatient Medications  Medication Sig Dispense Refill  . aspirin EC 81 MG tablet Take 81 mg by mouth daily.    . carvedilol (COREG) 3.125 MG tablet Take 1 tablet (3.125 mg total) by mouth 2 (two) times daily. 180 tablet 3  . celecoxib (CELEBREX) 200 MG capsule Take 200 mg by mouth daily.    . cyclobenzaprine  (FLEXERIL) 10 MG tablet Take 10 mg by mouth daily as needed for muscle spasms.    Marland Kitchen ezetimibe-simvastatin (VYTORIN) 10-40 MG tablet Take 1 tablet by mouth daily.    . insulin degludec (TRESIBA FLEXTOUCH) 100 UNIT/ML SOPN FlexTouch Pen Inject 30-40 Units into the skin daily.    Marland Kitchen liraglutide (VICTOZA) 18 MG/3ML SOPN Inject 1.8 mg into the skin daily.    . metFORMIN (GLUCOPHAGE) 1000 MG tablet Take 1,000 mg by mouth 2 (two) times daily with a meal.    . metroNIDAZOLE (METROCREAM) 0.75 % cream  Apply 1 application topically as directed.    . niacin (NIASPAN) 1000 MG CR tablet Take 2,000 mg by mouth at bedtime.    . traZODone (DESYREL) 100 MG tablet Take 100 mg by mouth at bedtime as needed for sleep.     No current facility-administered medications for this visit.     REVIEW OF SYSTEMS:    A 10+ POINT REVIEW OF SYSTEMS WAS OBTAINED including neurology, dermatology, psychiatry, cardiac, respiratory, lymph, extremities, GI, GU, Musculoskeletal, constitutional, breasts, reproductive, HEENT.  All pertinent positives are noted in the HPI.  All others are negative.   PHYSICAL EXAMINATION:  Vitals:   08/29/18 1455  BP: 138/89  Pulse: 81  Resp: 18  Temp: 98 F (36.7 C)  SpO2: 94%   Filed Weights   08/29/18 1455  Weight: 237 lb 4.8 oz (107.6 kg)   .Body mass index is 42.04 kg/m.  GENERAL:alert, in no acute distress and comfortable SKIN: no acute rashes, no significant lesions EYES: conjunctiva are pink and non-injected, sclera anicteric OROPHARYNX: MMM, no exudates, no oropharyngeal erythema or ulceration NECK: supple, no JVD LYMPH:  no palpable lymphadenopathy in the cervical, axillary or inguinal regions LUNGS: clear to auscultation b/l with normal respiratory effort HEART: regular rate & rhythm ABDOMEN:  normoactive bowel sounds , non tender, not distended. No palpable hepatosplenomegaly.  Extremity: no pedal edema PSYCH: alert & oriented x 3 with fluent speech NEURO: no focal  motor/sensory deficits   LABORATORY DATA:  I have reviewed the data as listed  . CBC Latest Ref Rng & Units 08/05/2018  WBC 3.9 - 10.3 K/uL 13.9(H)  Hemoglobin 11.6 - 15.9 g/dL 13.0  Hematocrit 34.8 - 46.6 % 41.0  Platelets 145 - 400 K/uL 278   . CBC    Component Value Date/Time   WBC 13.9 (H) 08/05/2018 1437   RBC 5.08 08/05/2018 1437   HGB 13.0 08/05/2018 1437   HCT 41.0 08/05/2018 1437   PLT 278 08/05/2018 1437   MCV 80.6 08/05/2018 1437   MCH 25.6 08/05/2018 1437   MCHC 31.7 08/05/2018 1437   RDW 15.6 (H) 08/05/2018 1437   LYMPHSABS 5.1 (H) 08/05/2018 1437   MONOABS 0.9 08/05/2018 1437   EOSABS 0.3 08/05/2018 1437   BASOSABS 0.1 08/05/2018 1437     . CMP Latest Ref Rng & Units 08/05/2018  Glucose 70 - 99 mg/dL 114(H)  BUN 8 - 23 mg/dL 21  Creatinine 0.44 - 1.00 mg/dL 0.99  Sodium 135 - 145 mmol/L 140  Potassium 3.5 - 5.1 mmol/L 4.2  Chloride 98 - 111 mmol/L 102  CO2 22 - 32 mmol/L 25  Calcium 8.9 - 10.3 mg/dL 10.1  Total Protein 6.5 - 8.1 g/dL 7.6  Total Bilirubin 0.3 - 1.2 mg/dL 0.4  Alkaline Phos 38 - 126 U/L 75  AST 15 - 41 U/L 19  ALT 0 - 44 U/L 15   Component     Latest Ref Rng & Units 08/05/2018  Hep B Core Ab, Tot     Negative Negative  Hepatitis B Surface Ag     Negative Negative  HCV Ab     0.0 - 0.9 s/co ratio <0.1  LDH     98 - 192 U/L 175     07/03/18 CBC w/diff:    RADIOGRAPHIC STUDIES: I have personally reviewed the radiological images as listed and agreed with the findings in the report. No results found.  ASSESSMENT & PLAN:  72 y.o. female with  1. Leukocytosis - primarily  lymphocytosis. PLAN -08/05/18 Hep B and Hep C serologies neg -Per patient records, she has had recurrent mildly elevated WBCs.  -Discussed pt labwork from 08/05/18; Lymphs abs at 5.1k, LDH normal at 175, chemistries stable. No anemia, no thrombocytopenia.  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 -Will order PET/CT -Will order FISH to evaluate for translocation 11,14 and rule out Mantel cell lymphoma though less likely -Will see the pt back in 3 weeks    Labs and PET/CT in 10 days RTC with Dr Irene Limbo in 3 weeks   All of the patients questions were answered with apparent satisfaction. The patient knows to call the clinic with any problems, questions or concerns.  The total time spent in the appt was 25 minutes and more than 50% was on counseling and direct patient cares.     Sullivan Lone MD [place] AAHIVMS [facility] Hematology/Oncology Physician [facility]  (Office):       [phone] (Work cell):  [phone] (Fax):           [phone]  08/29/2018 4:09 PM  I, Baldwin [place], am acting as a scribe for Dr. Irene Limbo  .I have reviewed the above documentation for accuracy and completeness, and I agree with the above. Brunetta Genera MD

## 2018-08-29 ENCOUNTER — Telehealth: Payer: Self-pay

## 2018-08-29 ENCOUNTER — Inpatient Hospital Stay: Payer: Medicare HMO | Admitting: Hematology

## 2018-08-29 ENCOUNTER — Encounter: Payer: Self-pay | Admitting: Hematology

## 2018-08-29 VITALS — BP 138/89 | HR 81 | Temp 98.0°F | Resp 18 | Ht 63.0 in | Wt 237.3 lb

## 2018-08-29 DIAGNOSIS — Z7984 Long term (current) use of oral hypoglycemic drugs: Secondary | ICD-10-CM | POA: Diagnosis not present

## 2018-08-29 DIAGNOSIS — C8510 Unspecified B-cell lymphoma, unspecified site: Secondary | ICD-10-CM

## 2018-08-29 DIAGNOSIS — Z806 Family history of leukemia: Secondary | ICD-10-CM | POA: Diagnosis not present

## 2018-08-29 DIAGNOSIS — E119 Type 2 diabetes mellitus without complications: Secondary | ICD-10-CM | POA: Diagnosis not present

## 2018-08-29 DIAGNOSIS — D649 Anemia, unspecified: Secondary | ICD-10-CM | POA: Diagnosis not present

## 2018-08-29 DIAGNOSIS — Z87891 Personal history of nicotine dependence: Secondary | ICD-10-CM | POA: Diagnosis not present

## 2018-08-29 DIAGNOSIS — E559 Vitamin D deficiency, unspecified: Secondary | ICD-10-CM | POA: Diagnosis not present

## 2018-08-29 DIAGNOSIS — D72820 Lymphocytosis (symptomatic): Secondary | ICD-10-CM

## 2018-08-29 DIAGNOSIS — I10 Essential (primary) hypertension: Secondary | ICD-10-CM | POA: Diagnosis not present

## 2018-08-29 NOTE — Telephone Encounter (Signed)
Printed avs and calender of upcoming appointment. Per 10/31 los 

## 2018-08-29 NOTE — Patient Instructions (Signed)
Thank you for choosing [facility] to provide your oncology and hematology care.  To afford each patient quality time with our providers, please arrive 30 minutes before your scheduled appointment time.  If you arrive late for your appointment, you may be asked to reschedule.  We strive to give you quality time with our providers, and arriving late affects you and other patients whose appointments are after yours.    If you are a no show for multiple scheduled visits, you may be dismissed from the clinic at the providers discretion.     Again, thank you for choosing [facility], our hope is that these requests will decrease the amount of time that you wait before being seen by our physicians.  ______________________________________________________________________   Should you have questions after your visit to the [facility], please contact our office at [phone] between the hours of 8:30 and 4:30 p.m.    Voicemails left after 4:30p.m will not be returned until the following business day.     For prescription refill requests, please have your pharmacy contact us directly.  Please also try to allow 48 hours for prescription requests.     Please contact the scheduling department for questions regarding scheduling.  For scheduling of procedures such as PET scans, CT scans, MRI, Ultrasound, etc please contact central scheduling at [phone].     Resources For Cancer Patients and Caregivers:    Oncolink.org:  A wonderful resource for patients and healthcare providers for information regarding your disease, ways to tract your treatment, what to expect, etc.      American Cancer Society:  800-227-2345  Can help patients locate various types of support and financial assistance   Cancer Care: 1-800-813-HOPE (4673) Provides financial assistance, online support groups, medication/co-pay assistance.     [place] DSS:  336-641-3447 Where to apply  for food stamps, Medicaid, and utility assistance   Medicare Rights Center: 800-333-4114 Helps people with Medicare understand their rights and benefits, navigate the Medicare system, and secure the quality healthcare they deserve   SCAT: 336-333-6589 [place] [employer]'s shared-ride transportation service for eligible riders who have a disability that prevents them from riding the fixed route bus.     For additional information on assistance programs please contact our social worker:   Abigail Elmore:  [phone]  

## 2018-09-09 ENCOUNTER — Inpatient Hospital Stay: Payer: Medicare HMO | Attending: Hematology

## 2018-09-09 ENCOUNTER — Encounter (HOSPITAL_COMMUNITY): Payer: Medicare HMO

## 2018-09-09 ENCOUNTER — Encounter (HOSPITAL_COMMUNITY): Payer: Self-pay

## 2018-09-09 DIAGNOSIS — Z87891 Personal history of nicotine dependence: Secondary | ICD-10-CM | POA: Insufficient documentation

## 2018-09-09 DIAGNOSIS — E119 Type 2 diabetes mellitus without complications: Secondary | ICD-10-CM | POA: Insufficient documentation

## 2018-09-09 DIAGNOSIS — R911 Solitary pulmonary nodule: Secondary | ICD-10-CM | POA: Insufficient documentation

## 2018-09-09 DIAGNOSIS — I10 Essential (primary) hypertension: Secondary | ICD-10-CM | POA: Insufficient documentation

## 2018-09-09 DIAGNOSIS — D72820 Lymphocytosis (symptomatic): Secondary | ICD-10-CM | POA: Insufficient documentation

## 2018-09-09 DIAGNOSIS — C8510 Unspecified B-cell lymphoma, unspecified site: Secondary | ICD-10-CM

## 2018-09-09 DIAGNOSIS — E559 Vitamin D deficiency, unspecified: Secondary | ICD-10-CM | POA: Insufficient documentation

## 2018-09-18 ENCOUNTER — Ambulatory Visit (HOSPITAL_COMMUNITY)
Admission: RE | Admit: 2018-09-18 | Discharge: 2018-09-18 | Disposition: A | Discharge status: Home / Self Care | Payer: Medicare HMO | Source: Ambulatory Visit | Attending: Hematology | Admitting: Hematology

## 2018-09-18 DIAGNOSIS — C8512 Unspecified B-cell lymphoma, intrathoracic lymph nodes: Secondary | ICD-10-CM | POA: Diagnosis not present

## 2018-09-18 DIAGNOSIS — C8510 Unspecified B-cell lymphoma, unspecified site: Secondary | ICD-10-CM | POA: Insufficient documentation

## 2018-09-18 LAB — GLUCOSE, CAPILLARY: GLUCOSE-CAPILLARY: 91 mg/dL (ref 70–99)

## 2018-09-18 MED ORDER — FLUDEOXYGLUCOSE F - 18 (FDG) INJECTION
11.8400 | Freq: Once | INTRAVENOUS | Status: AC
  Administered 2018-09-18: 11.84 via INTRAVENOUS

## 2018-09-19 DIAGNOSIS — R69 Illness, unspecified: Secondary | ICD-10-CM | POA: Diagnosis not present

## 2018-09-20 ENCOUNTER — Telehealth: Payer: Self-pay | Admitting: Hematology

## 2018-09-20 ENCOUNTER — Inpatient Hospital Stay: Payer: Medicare HMO | Admitting: Hematology

## 2018-09-20 VITALS — BP 159/83 | HR 66 | Temp 98.0°F | Resp 18 | Ht 63.0 in | Wt 235.1 lb

## 2018-09-20 DIAGNOSIS — C8510 Unspecified B-cell lymphoma, unspecified site: Secondary | ICD-10-CM

## 2018-09-20 DIAGNOSIS — R911 Solitary pulmonary nodule: Secondary | ICD-10-CM

## 2018-09-20 DIAGNOSIS — D72820 Lymphocytosis (symptomatic): Secondary | ICD-10-CM

## 2018-09-20 DIAGNOSIS — E559 Vitamin D deficiency, unspecified: Secondary | ICD-10-CM | POA: Diagnosis not present

## 2018-09-20 DIAGNOSIS — Z87891 Personal history of nicotine dependence: Secondary | ICD-10-CM

## 2018-09-20 DIAGNOSIS — I10 Essential (primary) hypertension: Secondary | ICD-10-CM

## 2018-09-20 DIAGNOSIS — E119 Type 2 diabetes mellitus without complications: Secondary | ICD-10-CM

## 2018-09-20 LAB — FISH,CLL PROGNOSTIC PANEL

## 2018-09-20 LAB — TISSUE HYBRIDIZATION TO NCBH

## 2018-09-20 NOTE — Telephone Encounter (Signed)
Pt declined avs and calendar  °

## 2018-09-20 NOTE — Progress Notes (Signed)
HEMATOLOGY/ONCOLOGY CONSULTATION NOTE  Date of Service: 09/20/2018  Patient Care Team: Lawerance Cruel, MD as PCP - General (Family Medicine)  CHIEF COMPLAINTS/PURPOSE OF CONSULTATION:  Leukocytosis   HISTORY OF PRESENTING ILLNESS:   Cassandra Palmer is a wonderful 72 y.o. female who has been referred to [place] by Dr. Lawerance Cruel for evaluation and management of Leukocytosis. The pt reports that she is doing well overall. She has had elevated WBC's for the past several years. She notes that she has a hx of elevated leukocytes.  She notes that her blood sugar levels were mildly elevated due to a recent change in her medications due to her insurance not covering these medications. She has been on Metformin for the past 5 years. She has been on Victoza for 1.5 years. She has her labs checked regularly by her PCP. She denies any recent viral infections or flu like symptoms. She denies any recent illnesses within the past 5 years or recent abx use. She has never been on steroids or prednisone. She is UTD with her mammograms and colonoscopy (last one within the last 3 years).   The pt reports associated fatigue, joint stiffness every morning that alleviates with movement, recurrent night sweats at 4-5 AM (clammy sensation). She takes vitamin D and she has had an elevated pulse rate that was evaluated with an echocardiogram with relief of her fatigue. She denies joint pain, abdominal pain, weight loss, mouth sores, and any other symptoms. She denies any other new symptoms in the past 6 months or year. She denies hx of rheumatological symptoms, thyroid issues, blood transfusions, tattoos. She stopped taking Invokana approximately 1-1.5 years ago due to recurrent yeast infections.   Most recent lab results (07/03/18) of CBC w/diff and CMP is as follows: all values are WNL except for WBC at 11.3k, MCV at 80.8, MCH at 26.0, Lymphs abs at 4.1k, Monocytes abs at 900, Glucose at 143.  On PMHx the  pt reports Vitamin D deficiency, Essential HTN, hyperlipidemia associated with DM Type 2, anemia, insomnia, Rosacea, seasonal allergies, and sciatic back pain radiating to right leg. On Social Hx the pt reports she used to smoke cigarettes in her 20's however, she hasn't smoked for the last 50 years. On Family Hx the pt reports Thyroid issues involving her mother and sister. Her mother had a hx of CLL. She notes rheumatic fever on her father's side.   Prior to retiring, she was a Tree surgeon for a [employer]   Interval History:   Cassandra Palmer returns today for management and evaluation of her Lymphocytosis. The patient's last visit with [place] was on 08/29/18. The pt reports that she is doing well overall.   The pt reports that she has not developed any new concerns in the interim. The pt has sciatica in her right leg and she sees Dr. Melina Schools in orthopedics. She hasn't had a recent MRI. She isn't able to distinguish any distinct pain in her right hip, as being different from her sciatica pain.   Of note since the patient's last visit, pt has had a FISH CLL panel completed on 09/09/18 with results revealing 17% of cells with mono-allelic 35D deletion, 97% of cells with bi-allelic 41U deletion, and 49% cells with normal nuclei.  The pt has also had a PET/CT completed on 09/18/18 which revealed Focal hypermetabolism in the proximal right femoral metaphysis without discrete bone lesion on the CT images, suspicious for osseous lymphoma. 2. No additional  metabolic evidence of lymphoma. No hypermetabolic lymphadenopathy. Normal size non-hypermetabolic spleen. 3. Few scattered solid pulmonary nodules, largest 7 mm, below PET resolution. Recommend attention on follow-up chest CT in 3-6 months. 4. Chronic findings include: Aortic Atherosclerosis. Coronary atherosclerosis. Small hiatal hernia. Moderate colonic diverticulosis. Cholelithiasis. Myomatous uterus.  On review of  systems, pt reports stable energy levels, and denies leg swelling, abdominal pains, distinct right hip pain, fevers, chills, night sweats, unexpected weight loss, and any other symptoms.    MEDICAL HISTORY:  Past Medical History:  Diagnosis Date  . Diabetes mellitus type 2, uncontrolled ([place])   . Hyperlipidemia   . Hypertension   . Sciatic nerve disease     SURGICAL HISTORY: Past Surgical History:  Procedure Laterality Date  . ELBOW SURGERY    . KNEE SURGERY      SOCIAL HISTORY: Social History   Socioeconomic History  . Marital status: Single    Spouse name: Not on file  . Number of children: 0  . Years of education: college  . Highest education level: Not on file  Occupational History  . Not on file  Social Needs  . Financial resource strain: Not on file  . Food insecurity:    Worry: Not on file    Inability: Not on file  . Transportation needs:    Medical: Not on file    Non-medical: Not on file  Tobacco Use  . Smoking status: Former Research scientist (life sciences)  . Smokeless tobacco: Never Used  Substance and Sexual Activity  . Alcohol use: Yes    Alcohol/week: 1.0 - 2.0 standard drinks    Types: 1 - 2 Glasses of wine per week    Comment: Occasional  . Drug use: No  . Sexual activity: Not Currently  Lifestyle  . Physical activity:    Days per week: Not on file    Minutes per session: Not on file  . Stress: Not on file  Relationships  . Social connections:    Talks on phone: Not on file    Gets together: Not on file    Attends religious service: Not on file    Active member of club or organization: Not on file    Attends meetings of clubs or organizations: Not on file    Relationship status: Not on file  . Intimate partner violence:    Fear of current or ex partner: Not on file    Emotionally abused: Not on file    Physically abused: Not on file    Forced sexual activity: Not on file  Other Topics Concern  . Not on file  Social History Narrative  . Not on file     FAMILY HISTORY: Family History  Problem Relation Age of Onset  . Congestive Heart Failure Mother   . Hypertension Mother   . Diabetes Father   . Heart Problems Father   . Hyperlipidemia Father   . Dementia Father   . Congestive Heart Failure Maternal Grandmother   . Heart attack Maternal Grandfather   . Heart disease Paternal Grandmother     ALLERGIES:  is allergic to invokana [canagliflozin] and actos [pioglitazone].  MEDICATIONS:  Current Outpatient Medications  Medication Sig Dispense Refill  . aspirin EC 81 MG tablet Take 81 mg by mouth daily.    . carvedilol (COREG) 3.125 MG tablet Take 1 tablet (3.125 mg total) by mouth 2 (two) times daily. 180 tablet 3  . celecoxib (CELEBREX) 200 MG capsule Take 200 mg by mouth daily.    Marland Kitchen  cyclobenzaprine (FLEXERIL) 10 MG tablet Take 10 mg by mouth daily as needed for muscle spasms.    Marland Kitchen ezetimibe-simvastatin (VYTORIN) 10-40 MG tablet Take 1 tablet by mouth daily.    . insulin degludec (TRESIBA FLEXTOUCH) 100 UNIT/ML SOPN FlexTouch Pen Inject 30-40 Units into the skin daily.    Marland Kitchen liraglutide (VICTOZA) 18 MG/3ML SOPN Inject 1.8 mg into the skin daily.    . metFORMIN (GLUCOPHAGE) 1000 MG tablet Take 1,000 mg by mouth 2 (two) times daily with a meal.    . metroNIDAZOLE (METROCREAM) 0.75 % cream Apply 1 application topically as directed.    . niacin (NIASPAN) 1000 MG CR tablet Take 2,000 mg by mouth at bedtime.    . traZODone (DESYREL) 100 MG tablet Take 100 mg by mouth at bedtime as needed for sleep.     No current facility-administered medications for this visit.     REVIEW OF SYSTEMS:    A 10+ POINT REVIEW OF SYSTEMS WAS OBTAINED including neurology, dermatology, psychiatry, cardiac, respiratory, lymph, extremities, GI, GU, Musculoskeletal, constitutional, breasts, reproductive, HEENT.  All pertinent positives are noted in the HPI.  All others are negative.   PHYSICAL EXAMINATION:  Vitals:   09/20/18 1227  BP: (!) 159/83  Pulse:  66  Resp: 18  Temp: 98 F (36.7 C)  SpO2: 99%   Filed Weights   09/20/18 1227  Weight: 235 lb 1.6 oz (106.6 kg)   .Body mass index is 41.65 kg/m.  GENERAL:alert, in no acute distress and comfortable SKIN: no acute rashes, no significant lesions EYES: conjunctiva are pink and non-injected, sclera anicteric OROPHARYNX: MMM, no exudates, no oropharyngeal erythema or ulceration NECK: supple, no JVD LYMPH:  no palpable lymphadenopathy in the cervical, axillary or inguinal regions LUNGS: clear to auscultation b/l with normal respiratory effort HEART: regular rate & rhythm ABDOMEN:  normoactive bowel sounds , non tender, not distended. No palpable hepatosplenomegaly.  Extremity: no pedal edema PSYCH: alert & oriented x 3 with fluent speech NEURO: no focal motor/sensory deficits    LABORATORY DATA:  I have reviewed the data as listed  . CBC Latest Ref Rng & Units 08/05/2018  WBC 3.9 - 10.3 K/uL 13.9(H)  Hemoglobin 11.6 - 15.9 g/dL 13.0  Hematocrit 34.8 - 46.6 % 41.0  Platelets 145 - 400 K/uL 278   . CBC    Component Value Date/Time   WBC 13.9 (H) 08/05/2018 1437   RBC 5.08 08/05/2018 1437   HGB 13.0 08/05/2018 1437   HCT 41.0 08/05/2018 1437   PLT 278 08/05/2018 1437   MCV 80.6 08/05/2018 1437   MCH 25.6 08/05/2018 1437   MCHC 31.7 08/05/2018 1437   RDW 15.6 (H) 08/05/2018 1437   LYMPHSABS 5.1 (H) 08/05/2018 1437   MONOABS 0.9 08/05/2018 1437   EOSABS 0.3 08/05/2018 1437   BASOSABS 0.1 08/05/2018 1437     . CMP Latest Ref Rng & Units 08/05/2018  Glucose 70 - 99 mg/dL 114(H)  BUN 8 - 23 mg/dL 21  Creatinine 0.44 - 1.00 mg/dL 0.99  Sodium 135 - 145 mmol/L 140  Potassium 3.5 - 5.1 mmol/L 4.2  Chloride 98 - 111 mmol/L 102  CO2 22 - 32 mmol/L 25  Calcium 8.9 - 10.3 mg/dL 10.1  Total Protein 6.5 - 8.1 g/dL 7.6  Total Bilirubin 0.3 - 1.2 mg/dL 0.4  Alkaline Phos 38 - 126 U/L 75  AST 15 - 41 U/L 19  ALT 0 - 44 U/L 15   Component  Latest Ref Rng & Units  08/05/2018  Hep B Core Ab, Tot     Negative Negative  Hepatitis B Surface Ag     Negative Negative  HCV Ab     0.0 - 0.9 s/co ratio <0.1  LDH     98 - 192 U/L 175   09/09/18 CLL FISH:      07/03/18 CBC w/diff:    RADIOGRAPHIC STUDIES: I have personally reviewed the radiological images as listed and agreed with the findings in the report. 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 EXAM: NUCLEAR MEDICINE PET SKULL BASE TO THIGH TECHNIQUE: 11.8 mCi F-18 FDG was injected intravenously. Full-ring PET imaging was performed from the skull base to thigh after the radiotracer. CT data was obtained and used for attenuation correction and anatomic localization. Fasting blood glucose: 91 mg/dl COMPARISON:  None. FINDINGS: Mediastinal blood pool activity: SUV max 3.4 NECK: No hypermetabolic lymph nodes in the neck. Incidental CT findings: none CHEST: No hypermetabolic axillary, mediastinal or hilar lymph nodes. No hypermetabolic pulmonary findings. Incidental CT findings: Coronary atherosclerosis. Atherosclerotic nonaneurysmal thoracic aorta. No acute consolidative airspace disease or lung masses. Three scattered solid pulmonary nodules, largest 7 mm in right lower lobe (series 8/image 28), below PET resolution. ABDOMEN/PELVIS: No abnormal hypermetabolic activity within the liver, pancreas, adrenal glands, or spleen. No hypermetabolic lymph nodes in the abdomen or pelvis. Incidental CT findings: Subcentimeter hypodense lateral segment left liver lobe lesion, too small to characterize. Cholelithiasis. Atherosclerotic nonaneurysmal abdominal aorta. Small hiatal hernia. Moderate colonic diverticulosis, most prominent in the sigmoid colon. Mildly enlarged myomatous uterus with coarsely calcified 4.6 cm right uterine fibroid. SKELETON: There is focal hypermetabolism in proximal right femoral metaphysis with max  SUV 6.1, without discrete osseous lesion on the CT images. No additional foci of skeletal hypermetabolism. Incidental CT findings: none IMPRESSION: 1. Focal hypermetabolism in the proximal right femoral metaphysis without discrete bone lesion on the CT images, suspicious for osseous lymphoma. 2. No additional metabolic evidence of lymphoma. No hypermetabolic lymphadenopathy. Normal size non-hypermetabolic spleen. 3. Few scattered solid pulmonary nodules, largest 7 mm, below PET resolution. Recommend attention on follow-up chest CT in 3-6 months. 4. Chronic findings include: Aortic Atherosclerosis (ICD10-I70.0). Coronary atherosclerosis. Small hiatal hernia. Moderate colonic diverticulosis. Cholelithiasis. Myomatous uterus. Electronically Signed   By: Ilona Sorrel M.D.   On: 09/18/2018 11:46    ASSESSMENT & PLAN:  72 y.o. female with  1. 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  -Discussed pt labwork from 08/05/18; Lymphs abs at 5.1k, LDH normal at 175, chemistries stable. No anemia, no thrombocytopenia.  -Discussed the 09/09/18 FISH CLL Prognostic panel which revealed 17% of cells with mono-allelic 23J deletion, 62% of cells with bi-allelic 83T deletion, and 49% cells with normal nuclei.  -Discussed the 09/18/18 PET/CT which revealed Focal hypermetabolism in the proximal right femoral metaphysis without discrete bone lesion on the CT images, suspicious for osseous lymphoma. 2. No additional metabolic evidence of lymphoma. No hypermetabolic lymphadenopathy. Normal size non-hypermetabolic spleen. 3. Few scattered solid pulmonary nodules, largest 7 mm, below PET resolution. Recommend attention on follow-up chest CT in 3-6 months. 4. Chronic findings include: Aortic  Atherosclerosis. Coronary atherosclerosis. Small hiatal hernia. Moderate  colonic diverticulosis. Cholelithiasis. Myomatous uterus. -Will continue to monitor pulmonary nodules on repeat imaging in 6 months -Right femur findings suggest bone inflammation vs Focal bone lymphoma, which is felt less likely but needs to be evaluated further -Will collect MRI Right Hip- She has no metal in her body.  -Will see the pt back in 3 weeks   MRI rt hip in 2 weeks RTC with Dr Irene Limbo in 3 weeks    All of the patients questions were answered with apparent satisfaction. The patient knows to call the clinic with any problems, questions or concerns.  The total time spent in the appt was 25 minutes and more than 50% was on counseling and direct patient cares.     Sullivan Lone MD MS AAHIVMS [facility] Hematology/Oncology Physician [facility]  (Office):       [phone] (Work cell):  [phone] (Fax):           [phone]  09/20/2018 1:29 PM  I, Baldwin [place], am acting as a scribe for Dr. Sullivan Lone.   .I have reviewed the above documentation for accuracy and completeness, and I agree with the above. Brunetta Genera MD

## 2018-09-27 ENCOUNTER — Telehealth: Payer: Self-pay | Admitting: *Deleted

## 2018-09-27 NOTE — Telephone Encounter (Signed)
Contacted by Radiology. Patient currently scheduled for MRI 10/05/18, Hip/Femur with contrast. Per Radiology scheduling, as patient has not had previous MRI without contrast, this order needs to be corrected to be for "with and without contrast".

## 2018-09-30 DIAGNOSIS — Z1231 Encounter for screening mammogram for malignant neoplasm of breast: Secondary | ICD-10-CM | POA: Diagnosis not present

## 2018-10-05 ENCOUNTER — Ambulatory Visit (HOSPITAL_COMMUNITY): Admission: RE | Admit: 2018-10-05 | Payer: Medicare HMO | Source: Ambulatory Visit

## 2018-10-07 DIAGNOSIS — H524 Presbyopia: Secondary | ICD-10-CM | POA: Diagnosis not present

## 2018-10-07 DIAGNOSIS — Z0100 Encounter for examination of eyes and vision without abnormal findings: Secondary | ICD-10-CM | POA: Diagnosis not present

## 2018-10-08 ENCOUNTER — Telehealth: Payer: Self-pay | Admitting: *Deleted

## 2018-10-08 NOTE — Telephone Encounter (Signed)
Contacted by [facility] for additional clinical information r/t MRI requested by Dr. Irene Limbo 10/12/18: MR FEMUR RIGHT [address] [place] @ [phone].

## 2018-10-09 ENCOUNTER — Inpatient Hospital Stay: Payer: Medicare HMO | Admitting: Hematology

## 2018-10-10 NOTE — Progress Notes (Signed)
HEMATOLOGY/ONCOLOGY CLINIC NOTE  Date of Service: 10/14/2018  Patient Care Team: Lawerance Cruel, MD as PCP - General (Family Medicine)  CHIEF COMPLAINTS/PURPOSE OF CONSULTATION:  Leukocytosis   HISTORY OF PRESENTING ILLNESS:   Cassandra Palmer is a wonderful 72 y.o. female who has been referred to [place] by Dr. Lawerance Cruel for evaluation and management of Leukocytosis. The pt reports that she is doing well overall. She has had elevated WBC's for the past several years. She notes that she has a hx of elevated leukocytes.  She notes that her blood sugar levels were mildly elevated due to a recent change in her medications due to her insurance not covering these medications. She has been on Metformin for the past 5 years. She has been on Victoza for 1.5 years. She has her labs checked regularly by her PCP. She denies any recent viral infections or flu like symptoms. She denies any recent illnesses within the past 5 years or recent abx use. She has never been on steroids or prednisone. She is UTD with her mammograms and colonoscopy (last one within the last 3 years).   The pt reports associated fatigue, joint stiffness every morning that alleviates with movement, recurrent night sweats at 4-5 AM (clammy sensation). She takes vitamin D and she has had an elevated pulse rate that was evaluated with an echocardiogram with relief of her fatigue. She denies joint pain, abdominal pain, weight loss, mouth sores, and any other symptoms. She denies any other new symptoms in the past 6 months or year. She denies hx of rheumatological symptoms, thyroid issues, blood transfusions, tattoos. She stopped taking Invokana approximately 1-1.5 years ago due to recurrent yeast infections.   Most recent lab results (07/03/18) of CBC w/diff and CMP is as follows: all values are WNL except for WBC at 11.3k, MCV at 80.8, MCH at 26.0, Lymphs abs at 4.1k, Monocytes abs at 900, Glucose at 143.  On PMHx the pt  reports Vitamin D deficiency, Essential HTN, hyperlipidemia associated with DM Type 2, anemia, insomnia, Rosacea, seasonal allergies, and sciatic back pain radiating to right leg. On Social Hx the pt reports she used to smoke cigarettes in her 20's however, she hasn't smoked for the last 50 years. On Family Hx the pt reports Thyroid issues involving her mother and sister. Her mother had a hx of CLL. She notes rheumatic fever on her father's side.   Prior to retiring, she was a Tree surgeon for a [employer]   Interval History:   Cassandra Palmer returns today for management and evaluation of her Lymphocytosis. The patient's last visit with [place] was on 09/20/18. The pt reports that she is doing well overall.   The pt reports that she has not developed any new concerns since our last visit. She notes that she continues to have stable sciatica in her right leg but denies any new hip pain. The pt denies developing any constitutional symptoms and denies any current concerns for infections.   Of note prior to today's visit the pt has had an MRI Femur Right completed on 10/12/18 which revealed Signal in the region of increased activity on PET scan correlates with red marrow but is subtly more focal and decreased on T1 weighted imaging compared to other imaged bones. Given activity on PET, the finding is worrisome for focal lymphoma involvement.  On review of systems, pt reports stable energy levels, stable right leg sciatica, eating well, and denies fevers, chills, night sweats,  concerns for infections, and any other symptoms.    MEDICAL HISTORY:  Past Medical History:  Diagnosis Date  . Diabetes mellitus type 2, uncontrolled ([place])   . Hyperlipidemia   . Hypertension   . Sciatic nerve disease     SURGICAL HISTORY: Past Surgical History:  Procedure Laterality Date  . ELBOW SURGERY    . KNEE SURGERY      SOCIAL HISTORY: Social History   Socioeconomic History  .  Marital status: Single    Spouse name: Not on file  . Number of children: 0  . Years of education: college  . Highest education level: Not on file  Occupational History  . Not on file  Social Needs  . Financial resource strain: Not on file  . Food insecurity:    Worry: Not on file    Inability: Not on file  . Transportation needs:    Medical: Not on file    Non-medical: Not on file  Tobacco Use  . Smoking status: Former Research scientist (life sciences)  . Smokeless tobacco: Never Used  Substance and Sexual Activity  . Alcohol use: Yes    Alcohol/week: 1.0 - 2.0 standard drinks    Types: 1 - 2 Glasses of wine per week    Comment: Occasional  . Drug use: No  . Sexual activity: Not Currently  Lifestyle  . Physical activity:    Days per week: Not on file    Minutes per session: Not on file  . Stress: Not on file  Relationships  . Social connections:    Talks on phone: Not on file    Gets together: Not on file    Attends religious service: Not on file    Active member of club or organization: Not on file    Attends meetings of clubs or organizations: Not on file    Relationship status: Not on file  . Intimate partner violence:    Fear of current or ex partner: Not on file    Emotionally abused: Not on file    Physically abused: Not on file    Forced sexual activity: Not on file  Other Topics Concern  . Not on file  Social History Narrative  . Not on file    FAMILY HISTORY: Family History  Problem Relation Age of Onset  . Congestive Heart Failure Mother   . Hypertension Mother   . Diabetes Father   . Heart Problems Father   . Hyperlipidemia Father   . Dementia Father   . Congestive Heart Failure Maternal Grandmother   . Heart attack Maternal Grandfather   . Heart disease Paternal Grandmother     ALLERGIES:  is allergic to invokana [canagliflozin] and actos [pioglitazone].  MEDICATIONS:  Current Outpatient Medications  Medication Sig Dispense Refill  . aspirin EC 81 MG tablet Take  81 mg by mouth daily.    . carvedilol (COREG) 3.125 MG tablet Take 1 tablet (3.125 mg total) by mouth 2 (two) times daily. 180 tablet 3  . celecoxib (CELEBREX) 200 MG capsule Take 200 mg by mouth daily.    . cyclobenzaprine (FLEXERIL) 10 MG tablet Take 10 mg by mouth daily as needed for muscle spasms.    Marland Kitchen ezetimibe-simvastatin (VYTORIN) 10-40 MG tablet Take 1 tablet by mouth daily.    . insulin degludec (TRESIBA FLEXTOUCH) 100 UNIT/ML SOPN FlexTouch Pen Inject 30-40 Units into the skin daily.    Marland Kitchen liraglutide (VICTOZA) 18 MG/3ML SOPN Inject 1.8 mg into the skin daily.    Marland Kitchen  metFORMIN (GLUCOPHAGE) 1000 MG tablet Take 1,000 mg by mouth 2 (two) times daily with a meal.    . metroNIDAZOLE (METROCREAM) 0.75 % cream Apply 1 application topically as directed.    . niacin (NIASPAN) 1000 MG CR tablet Take 2,000 mg by mouth at bedtime.    . traZODone (DESYREL) 100 MG tablet Take 100 mg by mouth at bedtime as needed for sleep.     No current facility-administered medications for this visit.     REVIEW OF SYSTEMS:    A 10+ POINT REVIEW OF SYSTEMS WAS OBTAINED including neurology, dermatology, psychiatry, cardiac, respiratory, lymph, extremities, GI, GU, Musculoskeletal, constitutional, breasts, reproductive, HEENT.  All pertinent positives are noted in the HPI.  All others are negative.   PHYSICAL EXAMINATION:  Vitals:   10/14/18 0904  BP: 137/88  Pulse: 72  Resp: 18  Temp: 98.2 F (36.8 C)  SpO2: 97%   Filed Weights   10/14/18 0904  Weight: 236 lb 8 oz (107.3 kg)   .Body mass index is 40.6 kg/m.  GENERAL:alert, in no acute distress and comfortable SKIN: no acute rashes, no significant lesions EYES: conjunctiva are pink and non-injected, sclera anicteric OROPHARYNX: MMM, no exudates, no oropharyngeal erythema or ulceration NECK: supple, no JVD LYMPH:  no palpable lymphadenopathy in the cervical, axillary or inguinal regions LUNGS: clear to auscultation b/l with normal respiratory  effort HEART: regular rate & rhythm ABDOMEN:  normoactive bowel sounds , non tender, not distended. No palpable hepatosplenomegaly.  Extremity: no pedal edema PSYCH: alert & oriented x 3 with fluent speech NEURO: no focal motor/sensory deficits   LABORATORY DATA:  I have reviewed the data as listed  . CBC Latest Ref Rng & Units 08/05/2018  WBC 3.9 - 10.3 K/uL 13.9(H)  Hemoglobin 11.6 - 15.9 g/dL 13.0  Hematocrit 34.8 - 46.6 % 41.0  Platelets 145 - 400 K/uL 278   . CBC    Component Value Date/Time   WBC 13.9 (H) 08/05/2018 1437   RBC 5.08 08/05/2018 1437   HGB 13.0 08/05/2018 1437   HCT 41.0 08/05/2018 1437   PLT 278 08/05/2018 1437   MCV 80.6 08/05/2018 1437   MCH 25.6 08/05/2018 1437   MCHC 31.7 08/05/2018 1437   RDW 15.6 (H) 08/05/2018 1437   LYMPHSABS 5.1 (H) 08/05/2018 1437   MONOABS 0.9 08/05/2018 1437   EOSABS 0.3 08/05/2018 1437   BASOSABS 0.1 08/05/2018 1437     . CMP Latest Ref Rng & Units 08/05/2018  Glucose 70 - 99 mg/dL 114(H)  BUN 8 - 23 mg/dL 21  Creatinine 0.44 - 1.00 mg/dL 0.99  Sodium 135 - 145 mmol/L 140  Potassium 3.5 - 5.1 mmol/L 4.2  Chloride 98 - 111 mmol/L 102  CO2 22 - 32 mmol/L 25  Calcium 8.9 - 10.3 mg/dL 10.1  Total Protein 6.5 - 8.1 g/dL 7.6  Total Bilirubin 0.3 - 1.2 mg/dL 0.4  Alkaline Phos 38 - 126 U/L 75  AST 15 - 41 U/L 19  ALT 0 - 44 U/L 15   Component     Latest Ref Rng & Units 08/05/2018  Hep B Core Ab, Tot     Negative Negative  Hepatitis B Surface Ag     Negative Negative  HCV Ab     0.0 - 0.9 s/co ratio <0.1  LDH     98 - 192 U/L 175   09/09/18 CLL FISH:      07/03/18 CBC w/diff:    RADIOGRAPHIC STUDIES: I have  personally reviewed the radiological images as listed and agreed with the findings in the report. Mr Femur Right W Wo Contrast  Result Date: 10/12/2018 CLINICAL DATA:  Focal uptake in the proximal right femur on PET CT scan 09/18/2018 in patient with a history of B-cell lymphoma. EXAM: MRI OF THE  RIGHT FEMUR WITHOUT AND WITH CONTRAST TECHNIQUE: Multiplanar, multisequence MR imaging of the right femur was performed both before and after administration of intravenous contrast. CONTRAST:  10 cc Gadavist IV. COMPARISON:  PET CT scan 09/18/2018. FINDINGS: Bones/Joint/Cartilage Marrow signal is heterogeneous in all imaged bones, particularly the visualized pelvis and proximal femurs on the right and left. Somewhat more focal area of decreased signal on T1 weighted imaging is seen at the level of the lesser trochanter of the right femur. This area demonstrates signal units of 563 on T1 weighted imaging in the right femur and 722 in the same location of the left femur. This area remains hyperintense to skeletal muscle on T1 weighted imaging. There is mild increased T2 signal in this location. Postcontrast imaging demonstrates a somewhat greater degree of enhancement than other medullary foci in the femur. No endosteal scalloping, periosteal reaction or soft tissue mass is identified. No cortical break is seen. Ligaments Negative. Muscles and Tendons Normal. Soft tissues Negative. IMPRESSION: Signal in the region of increased activity on PET scan correlates with red marrow but is subtly more focal and decreased on T1 weighted imaging compared to other imaged bones. Given activity on PET, the finding is worrisome for focal lymphoma involvement. Electronically Signed   By: Inge Rise M.D.   On: 10/12/2018 19:55   Nm Pet Image Initial (pi) Skull Base To Thigh  Result Date: 09/18/2018 CLINICAL DATA:  Initial treatment strategy for B-cell lymphoma (CLL/SLL). EXAM: NUCLEAR MEDICINE PET SKULL BASE TO THIGH TECHNIQUE: 11.8 mCi F-18 FDG was injected intravenously. Full-ring PET imaging was performed from the skull base to thigh after the radiotracer. CT data was obtained and used for attenuation correction and anatomic localization. Fasting blood glucose: 91 mg/dl COMPARISON:  None. FINDINGS: Mediastinal blood pool  activity: SUV max 3.4 NECK: No hypermetabolic lymph nodes in the neck. Incidental CT findings: none CHEST: No hypermetabolic axillary, mediastinal or hilar lymph nodes. No hypermetabolic pulmonary findings. Incidental CT findings: Coronary atherosclerosis. Atherosclerotic nonaneurysmal thoracic aorta. No acute consolidative airspace disease or lung masses. Three scattered solid pulmonary nodules, largest 7 mm in right lower lobe (series 8/image 28), below PET resolution. ABDOMEN/PELVIS: No abnormal hypermetabolic activity within the liver, pancreas, adrenal glands, or spleen. No hypermetabolic lymph nodes in the abdomen or pelvis. Incidental CT findings: Subcentimeter hypodense lateral segment left liver lobe lesion, too small to characterize. Cholelithiasis. Atherosclerotic nonaneurysmal abdominal aorta. Small hiatal hernia. Moderate colonic diverticulosis, most prominent in the sigmoid colon. Mildly enlarged myomatous uterus with coarsely calcified 4.6 cm right uterine fibroid. SKELETON: There is focal hypermetabolism in proximal right femoral metaphysis with max SUV 6.1, without discrete osseous lesion on the CT images. No additional foci of skeletal hypermetabolism. Incidental CT findings: none IMPRESSION: 1. Focal hypermetabolism in the proximal right femoral metaphysis without discrete bone lesion on the CT images, suspicious for osseous lymphoma. 2. No additional metabolic evidence of lymphoma. No hypermetabolic lymphadenopathy. Normal size non-hypermetabolic spleen. 3. Few scattered solid pulmonary nodules, largest 7 mm, below PET resolution. Recommend attention on follow-up chest CT in 3-6 months. 4. Chronic findings include: Aortic Atherosclerosis (ICD10-I70.0). Coronary atherosclerosis. Small hiatal hernia. Moderate colonic diverticulosis. Cholelithiasis. Myomatous uterus. Electronically Signed  By: Ilona Sorrel M.D.   On: 09/18/2018 11:46    ASSESSMENT & PLAN:  72 y.o. female with  1. 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  08/05/18 Peripheral Blood Flow cytometry revealed CD5 positive monoclonal B-cell population at comprising 49% of total lymphocytes  08/05/18 Hep B and Hep C serologies neg   09/09/18 FISH CLL Prognostic panel revealed 17% of cells with mono-allelic 19E deletion, 17% of cells with bi-allelic 40C deletion, and 49% cells with normal nuclei.   09/18/18 PET/CT revealed Focal hypermetabolism in the proximal right femoral metaphysis without discrete bone lesion on the CT images, suspicious for osseous lymphoma. 2. No additional metabolic evidence of lymphoma. No hypermetabolic lymphadenopathy. Normal size non-hypermetabolic spleen. 3. Few scattered solid pulmonary nodules, largest 7 mm, below PET resolution. Recommend attention on follow-up chest CT in 3-6 months. 4. Chronic findings include: Aortic Atherosclerosis. Coronary atherosclerosis. Small hiatal hernia. Moderate colonic diverticulosis. Cholelithiasis. Myomatous uterus.   PLAN  -Discussed the 10/12/18 MRI Femur Right which revealed Signal in the region of increased activity on PET scan correlates with red marrow but is subtly more focal and decreased on T1 weighted imaging compared to other imaged bones. Given activity on PET, the finding is worrisome for focal lymphoma involvement.  -Right femur findings suggest active red marrow vs Focal bone lymphoma, which is felt less likely -Recommend watchful observation of right femur with repeat scan in 3 months, prior to next visit  -Would consider treatment if right femur shows signs of progression worrisome for possibility of fracture. Pt has no acute new hip pains, but does endorse chronic sciatica.  -Will continue to monitor pulmonary nodules on repeat imaging in 6 months -Will see the pt back in 3 months    RTC with Dr Irene Limbo with labs in 3 months   All of the patients questions were answered with apparent  satisfaction. The patient knows to call the clinic with any problems, questions or concerns.  The total time spent in the appt was 20 minutes and more than 50% was on counseling and direct patient cares.     Sullivan Lone MD MS AAHIVMS [facility] Hematology/Oncology Physician [facility]  (Office):       [phone] (Work cell):  [phone] (Fax):           [phone]  10/14/2018 9:27 AM  I, Baldwin [place], am acting as a scribe for Dr. Sullivan Lone.   .I have reviewed the above documentation for accuracy and completeness, and I agree with the above. Brunetta Genera MD

## 2018-10-12 ENCOUNTER — Ambulatory Visit (HOSPITAL_COMMUNITY)
Admission: RE | Admit: 2018-10-12 | Discharge: 2018-10-12 | Disposition: A | Discharge status: Home / Self Care | Payer: Medicare HMO | Source: Ambulatory Visit | Attending: Hematology | Admitting: Hematology

## 2018-10-12 ENCOUNTER — Ambulatory Visit (HOSPITAL_COMMUNITY): Payer: Medicare HMO

## 2018-10-12 DIAGNOSIS — Z8572 Personal history of non-Hodgkin lymphomas: Secondary | ICD-10-CM | POA: Diagnosis not present

## 2018-10-12 DIAGNOSIS — C8510 Unspecified B-cell lymphoma, unspecified site: Secondary | ICD-10-CM | POA: Insufficient documentation

## 2018-10-12 MED ORDER — GADOBUTROL 1 MMOL/ML IV SOLN
10.0000 mL | Freq: Once | INTRAVENOUS | Status: AC | PRN
  Administered 2018-10-12: 10 mL via INTRAVENOUS

## 2018-10-14 ENCOUNTER — Encounter: Payer: Self-pay | Admitting: Hematology

## 2018-10-14 ENCOUNTER — Telehealth: Payer: Self-pay | Admitting: Hematology

## 2018-10-14 ENCOUNTER — Inpatient Hospital Stay: Payer: Medicare HMO | Attending: Hematology | Admitting: Hematology

## 2018-10-14 VITALS — BP 137/88 | HR 72 | Temp 98.2°F | Resp 18 | Ht 64.0 in | Wt 236.5 lb

## 2018-10-14 DIAGNOSIS — E559 Vitamin D deficiency, unspecified: Secondary | ICD-10-CM | POA: Diagnosis not present

## 2018-10-14 DIAGNOSIS — D72829 Elevated white blood cell count, unspecified: Secondary | ICD-10-CM | POA: Diagnosis not present

## 2018-10-14 DIAGNOSIS — Z87891 Personal history of nicotine dependence: Secondary | ICD-10-CM | POA: Insufficient documentation

## 2018-10-14 DIAGNOSIS — D72820 Lymphocytosis (symptomatic): Secondary | ICD-10-CM | POA: Insufficient documentation

## 2018-10-14 DIAGNOSIS — R911 Solitary pulmonary nodule: Secondary | ICD-10-CM | POA: Insufficient documentation

## 2018-10-14 DIAGNOSIS — E119 Type 2 diabetes mellitus without complications: Secondary | ICD-10-CM

## 2018-10-14 DIAGNOSIS — C9110 Chronic lymphocytic leukemia of B-cell type not having achieved remission: Secondary | ICD-10-CM

## 2018-10-14 DIAGNOSIS — I10 Essential (primary) hypertension: Secondary | ICD-10-CM | POA: Insufficient documentation

## 2018-10-14 DIAGNOSIS — M5431 Sciatica, right side: Secondary | ICD-10-CM | POA: Diagnosis not present

## 2018-10-14 NOTE — Patient Instructions (Signed)
Thank you for choosing [facility] to provide your oncology and hematology care.  To afford each patient quality time with our providers, please arrive 30 minutes before your scheduled appointment time.  If you arrive late for your appointment, you may be asked to reschedule.  We strive to give you quality time with our providers, and arriving late affects you and other patients whose appointments are after yours.    If you are a no show for multiple scheduled visits, you may be dismissed from the clinic at the providers discretion.     Again, thank you for choosing [facility], our hope is that these requests will decrease the amount of time that you wait before being seen by our physicians.  ______________________________________________________________________   Should you have questions after your visit to the [facility], please contact our office at [phone] between the hours of 8:30 and 4:30 p.m.    Voicemails left after 4:30p.m will not be returned until the following business day.     For prescription refill requests, please have your pharmacy contact us directly.  Please also try to allow 48 hours for prescription requests.     Please contact the scheduling department for questions regarding scheduling.  For scheduling of procedures such as PET scans, CT scans, MRI, Ultrasound, etc please contact central scheduling at [phone].     Resources For Cancer Patients and Caregivers:    Oncolink.org:  A wonderful resource for patients and healthcare providers for information regarding your disease, ways to tract your treatment, what to expect, etc.      American Cancer Society:  800-227-2345  Can help patients locate various types of support and financial assistance   Cancer Care: 1-800-813-HOPE (4673) Provides financial assistance, online support groups, medication/co-pay assistance.     [place] DSS:  336-641-3447 Where to apply  for food stamps, Medicaid, and utility assistance   Medicare Rights Center: 800-333-4114 Helps people with Medicare understand their rights and benefits, navigate the Medicare system, and secure the quality healthcare they deserve   SCAT: 336-333-6589 [place] [employer]'s shared-ride transportation service for eligible riders who have a disability that prevents them from riding the fixed route bus.     For additional information on assistance programs please contact our social worker:   Abigail Elmore:  [phone]  

## 2018-10-14 NOTE — Telephone Encounter (Signed)
Gave patient avs report and appointments for March  °

## 2018-11-25 DIAGNOSIS — M4316 Spondylolisthesis, lumbar region: Secondary | ICD-10-CM | POA: Diagnosis not present

## 2018-11-25 DIAGNOSIS — E119 Type 2 diabetes mellitus without complications: Secondary | ICD-10-CM | POA: Insufficient documentation

## 2018-11-25 DIAGNOSIS — Z6841 Body Mass Index (BMI) 40.0 and over, adult: Secondary | ICD-10-CM | POA: Diagnosis not present

## 2018-11-25 DIAGNOSIS — M545 Low back pain: Secondary | ICD-10-CM | POA: Diagnosis not present

## 2018-12-17 DIAGNOSIS — R69 Illness, unspecified: Secondary | ICD-10-CM | POA: Diagnosis not present

## 2019-01-10 ENCOUNTER — Inpatient Hospital Stay: Payer: Medicare HMO | Attending: Hematology

## 2019-01-10 ENCOUNTER — Other Ambulatory Visit: Payer: Self-pay

## 2019-01-10 DIAGNOSIS — R911 Solitary pulmonary nodule: Secondary | ICD-10-CM | POA: Diagnosis not present

## 2019-01-10 DIAGNOSIS — D72820 Lymphocytosis (symptomatic): Secondary | ICD-10-CM | POA: Diagnosis present

## 2019-01-10 DIAGNOSIS — C9110 Chronic lymphocytic leukemia of B-cell type not having achieved remission: Secondary | ICD-10-CM

## 2019-01-10 DIAGNOSIS — D72829 Elevated white blood cell count, unspecified: Secondary | ICD-10-CM | POA: Insufficient documentation

## 2019-01-10 LAB — CMP (CANCER CENTER ONLY)
ALT: 14 U/L (ref 0–44)
AST: 18 U/L (ref 15–41)
Albumin: 4 g/dL (ref 3.5–5.0)
Alkaline Phosphatase: 83 U/L (ref 38–126)
Anion gap: 11 (ref 5–15)
BUN: 14 mg/dL (ref 8–23)
CHLORIDE: 103 mmol/L (ref 98–111)
CO2: 26 mmol/L (ref 22–32)
Calcium: 10.1 mg/dL (ref 8.9–10.3)
Creatinine: 0.8 mg/dL (ref 0.44–1.00)
GFR, Estimated: >60 mL/min (ref >60)
GLUCOSE: 106 mg/dL — AB (ref 70–99)
POTASSIUM: 4.5 mmol/L (ref 3.5–5.1)
Sodium: 140 mmol/L (ref 135–145)
Total Bilirubin: 0.4 mg/dL (ref 0.3–1.2)
Total Protein: 7 g/dL (ref 6.5–8.1)

## 2019-01-10 LAB — CBC WITH DIFFERENTIAL/PLATELET
ABS IMMATURE GRANULOCYTES: 0.03 10*3/uL (ref 0.00–0.07)
BASOS ABS: 0.1 10*3/uL (ref 0.0–0.1)
Basophils Relative: 1 %
Eosinophils Absolute: 0.7 10*3/uL — ABNORMAL HIGH (ref 0.0–0.5)
Eosinophils Relative: 6 %
HCT: 42.5 % (ref 36.0–46.0)
HEMOGLOBIN: 13 g/dL (ref 12.0–15.0)
IMMATURE GRANULOCYTES: 0 %
LYMPHS ABS: 4.3 10*3/uL — AB (ref 0.7–4.0)
LYMPHS PCT: 35 %
MCH: 26.1 pg (ref 26.0–34.0)
MCHC: 30.6 g/dL (ref 30.0–36.0)
MCV: 85.3 fL (ref 80.0–100.0)
MONOS PCT: 6 %
Monocytes Absolute: 0.7 10*3/uL (ref 0.1–1.0)
NEUTROS ABS: 6.3 10*3/uL (ref 1.7–7.7)
NEUTROS PCT: 52 %
NRBC: 0 % (ref 0.0–0.2)
PLATELETS: 262 10*3/uL (ref 150–400)
RBC: 4.98 MIL/uL (ref 3.87–5.11)
RDW: 13.9 % (ref 11.5–15.5)
WBC: 12.2 10*3/uL — ABNORMAL HIGH (ref 4.0–10.5)

## 2019-01-10 LAB — LACTATE DEHYDROGENASE: LDH: 171 U/L (ref 98–192)

## 2019-01-13 DIAGNOSIS — E1169 Type 2 diabetes mellitus with other specified complication: Secondary | ICD-10-CM | POA: Diagnosis not present

## 2019-01-13 NOTE — Progress Notes (Signed)
HEMATOLOGY/ONCOLOGY CLINIC NOTE  Date of Service: 01/14/2019  Patient Care Team: Lawerance Cruel, MD as PCP - General (Family Medicine)  CHIEF COMPLAINTS/PURPOSE OF CONSULTATION:  Leukocytosis   HISTORY OF PRESENTING ILLNESS:   Cassandra Palmer is a wonderful 73 y.o. female who has been referred to [place] by Dr. Lawerance Cruel for evaluation and management of Leukocytosis. The pt reports that she is doing well overall. She has had elevated WBC's for the past several years. She notes that she has a hx of elevated leukocytes.  She notes that her blood sugar levels were mildly elevated due to a recent change in her medications due to her insurance not covering these medications. She has been on Metformin for the past 5 years. She has been on Victoza for 1.5 years. She has her labs checked regularly by her PCP. She denies any recent viral infections or flu like symptoms. She denies any recent illnesses within the past 5 years or recent abx use. She has never been on steroids or prednisone. She is UTD with her mammograms and colonoscopy (last one within the last 3 years).   The pt reports associated fatigue, joint stiffness every morning that alleviates with movement, recurrent night sweats at 4-5 AM (clammy sensation). She takes vitamin D and she has had an elevated pulse rate that was evaluated with an echocardiogram with relief of her fatigue. She denies joint pain, abdominal pain, weight loss, mouth sores, and any other symptoms. She denies any other new symptoms in the past 6 months or year. She denies hx of rheumatological symptoms, thyroid issues, blood transfusions, tattoos. She stopped taking Invokana approximately 1-1.5 years ago due to recurrent yeast infections.   Most recent lab results (07/03/18) of CBC w/diff and CMP is as follows: all values are WNL except for WBC at 11.3k, MCV at 80.8, MCH at 26.0, Lymphs abs at 4.1k, Monocytes abs at 900, Glucose at 143.  On PMHx the pt  reports Vitamin D deficiency, Essential HTN, hyperlipidemia associated with DM Type 2, anemia, insomnia, Rosacea, seasonal allergies, and sciatic back pain radiating to right leg. On Social Hx the pt reports she used to smoke cigarettes in her 20's however, she hasn't smoked for the last 50 years. On Family Hx the pt reports Thyroid issues involving her mother and sister. Her mother had a hx of CLL. She notes rheumatic fever on her father's side.   Prior to retiring, she was a Tree surgeon for a [employer]   Interval History:   Cassandra Palmer returns today for management and evaluation of her Lymphocytosis. The patient's last visit with [place] was on 10/14/18. The pt reports that she is doing well overall.   The pt reports that she has not developed any new concerns in the interim. She denies any new or different hip or groin pain and has stable sciatica. She denies noticing any new lumps or bumps, fevers, chills, or night sweats. The pt denies any concerns for infections and endorses stable energy levels.  Lab results today (01/10/19) of CBC w/diff and CMP is as follows: all values are WNL except for WBC at 12.2k, Lymphs abs at 4.3k, Eosinophils abs at 700, Glucose at 106. 01/14/19 LDH at 171  On review of systems, pt reports stable energy levels, eating well, stable weight, and denies abdominal pains, new or different hip pains, fevers, chills, night sweats, new groin pain, noticing any new lumps or bumps, thigh pain, and any other symptoms.  MEDICAL HISTORY:  Past Medical History:  Diagnosis Date  . Diabetes mellitus type 2, uncontrolled ([place])   . Hyperlipidemia   . Hypertension   . Sciatic nerve disease     SURGICAL HISTORY: Past Surgical History:  Procedure Laterality Date  . ELBOW SURGERY    . KNEE SURGERY      SOCIAL HISTORY: Social History   Socioeconomic History  . Marital status: Single    Spouse name: Not on file  . Number of children: 0   . Years of education: college  . Highest education level: Not on file  Occupational History  . Not on file  Social Needs  . Financial resource strain: Not on file  . Food insecurity:    Worry: Not on file    Inability: Not on file  . Transportation needs:    Medical: Not on file    Non-medical: Not on file  Tobacco Use  . Smoking status: Former Research scientist (life sciences)  . Smokeless tobacco: Never Used  Substance and Sexual Activity  . Alcohol use: Yes    Alcohol/week: 1.0 - 2.0 standard drinks    Types: 1 - 2 Glasses of wine per week    Comment: Occasional  . Drug use: No  . Sexual activity: Not Currently  Lifestyle  . Physical activity:    Days per week: Not on file    Minutes per session: Not on file  . Stress: Not on file  Relationships  . Social connections:    Talks on phone: Not on file    Gets together: Not on file    Attends religious service: Not on file    Active member of club or organization: Not on file    Attends meetings of clubs or organizations: Not on file    Relationship status: Not on file  . Intimate partner violence:    Fear of current or ex partner: Not on file    Emotionally abused: Not on file    Physically abused: Not on file    Forced sexual activity: Not on file  Other Topics Concern  . Not on file  Social History Narrative  . Not on file    FAMILY HISTORY: Family History  Problem Relation Age of Onset  . Congestive Heart Failure Mother   . Hypertension Mother   . Diabetes Father   . Heart Problems Father   . Hyperlipidemia Father   . Dementia Father   . Congestive Heart Failure Maternal Grandmother   . Heart attack Maternal Grandfather   . Heart disease Paternal Grandmother     ALLERGIES:  is allergic to invokana [canagliflozin] and actos [pioglitazone].  MEDICATIONS:  Current Outpatient Medications  Medication Sig Dispense Refill  . aspirin EC 81 MG tablet Take 81 mg by mouth daily.    . carvedilol (COREG) 3.125 MG tablet Take 1 tablet  (3.125 mg total) by mouth 2 (two) times daily. 180 tablet 3  . celecoxib (CELEBREX) 200 MG capsule Take 200 mg by mouth daily.    . cyclobenzaprine (FLEXERIL) 10 MG tablet Take 10 mg by mouth daily as needed for muscle spasms.    Marland Kitchen ezetimibe-simvastatin (VYTORIN) 10-40 MG tablet Take 1 tablet by mouth daily.    . insulin degludec (TRESIBA FLEXTOUCH) 100 UNIT/ML SOPN FlexTouch Pen Inject 30-40 Units into the skin daily.    Marland Kitchen liraglutide (VICTOZA) 18 MG/3ML SOPN Inject 1.8 mg into the skin daily.    . metFORMIN (GLUCOPHAGE) 1000 MG tablet Take 1,000 mg by mouth  2 (two) times daily with a meal.    . metroNIDAZOLE (METROCREAM) 0.75 % cream Apply 1 application topically as directed.    . niacin (NIASPAN) 1000 MG CR tablet Take 2,000 mg by mouth at bedtime.    . traZODone (DESYREL) 100 MG tablet Take 100 mg by mouth at bedtime as needed for sleep.     No current facility-administered medications for this visit.     REVIEW OF SYSTEMS:    A 10+ POINT REVIEW OF SYSTEMS WAS OBTAINED including neurology, dermatology, psychiatry, cardiac, respiratory, lymph, extremities, GI, GU, Musculoskeletal, constitutional, breasts, reproductive, HEENT.  All pertinent positives are noted in the HPI.  All others are negative.   PHYSICAL EXAMINATION:  Vitals:   01/14/19 1143  BP: 128/85  Pulse: (!) 105  Resp: 18  Temp: 98.4 F (36.9 C)  SpO2: 95%   Filed Weights   01/14/19 1143  Weight: 234 lb 4.8 oz (106.3 kg)   .Body mass index is 40.22 kg/m.  GENERAL:alert, in no acute distress and comfortable SKIN: no acute rashes, no significant lesions EYES: conjunctiva are pink and non-injected, sclera anicteric OROPHARYNX: MMM, no exudates, no oropharyngeal erythema or ulceration NECK: supple, no JVD LYMPH:  no palpable lymphadenopathy in the cervical, axillary or inguinal regions LUNGS: clear to auscultation b/l with normal respiratory effort HEART: regular rate & rhythm ABDOMEN:  normoactive bowel sounds ,  non tender, not distended. No palpable hepatosplenomegaly.  Extremity: no pedal edema PSYCH: alert & oriented x 3 with fluent speech NEURO: no focal motor/sensory deficits   LABORATORY DATA:  I have reviewed the data as listed  . CBC Latest Ref Rng & Units 01/10/2019 08/05/2018  WBC 4.0 - 10.5 K/uL 12.2(H) 13.9(H)  Hemoglobin 12.0 - 15.0 g/dL 13.0 13.0  Hematocrit 36.0 - 46.0 % 42.5 41.0  Platelets 150 - 400 K/uL 262 278   . CBC    Component Value Date/Time   WBC 12.2 (H) 01/10/2019 1102   RBC 4.98 01/10/2019 1102   HGB 13.0 01/10/2019 1102   HCT 42.5 01/10/2019 1102   PLT 262 01/10/2019 1102   MCV 85.3 01/10/2019 1102   MCH 26.1 01/10/2019 1102   MCHC 30.6 01/10/2019 1102   RDW 13.9 01/10/2019 1102   LYMPHSABS 4.3 (H) 01/10/2019 1102   MONOABS 0.7 01/10/2019 1102   EOSABS 0.7 (H) 01/10/2019 1102   BASOSABS 0.1 01/10/2019 1102     . CMP Latest Ref Rng & Units 01/10/2019 08/05/2018  Glucose 70 - 99 mg/dL 106(H) 114(H)  BUN 8 - 23 mg/dL 14 21  Creatinine 0.44 - 1.00 mg/dL 0.80 0.99  Sodium 135 - 145 mmol/L 140 140  Potassium 3.5 - 5.1 mmol/L 4.5 4.2  Chloride 98 - 111 mmol/L 103 102  CO2 22 - 32 mmol/L 26 25  Calcium 8.9 - 10.3 mg/dL 10.1 10.1  Total Protein 6.5 - 8.1 g/dL 7.0 7.6  Total Bilirubin 0.3 - 1.2 mg/dL 0.4 0.4  Alkaline Phos 38 - 126 U/L 83 75  AST 15 - 41 U/L 18 19  ALT 0 - 44 U/L 14 15   Component     Latest Ref Rng & Units 08/05/2018  Hep B Core Ab, Tot     Negative Negative  Hepatitis B Surface Ag     Negative Negative  HCV Ab     0.0 - 0.9 s/co ratio <0.1  LDH     98 - 192 U/L 175   09/09/18 CLL FISH:  07/03/18 CBC w/diff:    RADIOGRAPHIC STUDIES: I have personally reviewed the radiological images as listed and agreed with the findings in the report. No results found.  ASSESSMENT & PLAN:  73 y.o. female with  1. 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  08/05/18  Peripheral Blood Flow cytometry revealed CD5 positive monoclonal B-cell population at comprising 49% of total lymphocytes  08/05/18 Hep B and Hep C serologies neg   09/09/18 FISH CLL Prognostic panel revealed 17% of cells with mono-allelic 81E deletion, 75% of cells with bi-allelic 17G deletion, and 49% cells with normal nuclei.   09/18/18 PET/CT revealed Focal hypermetabolism in the proximal right femoral metaphysis without discrete bone lesion on the CT images, suspicious for osseous lymphoma. 2. No additional metabolic evidence of lymphoma. No hypermetabolic lymphadenopathy. Normal size non-hypermetabolic spleen. 3. Few scattered solid pulmonary nodules, largest 7 mm, below PET resolution. Recommend attention on follow-up chest CT in 3-6 months. 4. Chronic findings include: Aortic Atherosclerosis. Coronary atherosclerosis. Small hiatal hernia. Moderate colonic diverticulosis. Cholelithiasis. Myomatous uterus.   10/12/18 MRI Femur Right revealed Signal in the region of increased activity on PET scan correlates with red marrow but is subtly more focal and decreased on T1 weighted imaging compared to other imaged bones. Given activity on PET, the finding is worrisome for focal lymphoma involvement.  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  -No indication to initiate treatment at this time.  -Right femur findings suggest active red marrow vs Focal bone lymphoma, which is felt less likely -Will look to follow spot in femur with XR before next visit and pulmonary nodule again with CT without contrast in 6 months, as pt has not developed any new symptoms, though she will let me know if she does in the interim -Would consider treatment if right femur shows signs of progression worrisome for possibility of fracture. Pt has no acute new hip pains, but does  endorse chronic sciatica. -Advised crowd avoidance and infection prevention strategies -Will see the pt back in 6 months   CT chest wo contrast in 24 weeks X Ray rt femur in 24 weeks RTC with Dr Irene Limbo with labs in 6 months   All of the patients questions were answered with apparent satisfaction. The patient knows to call the clinic with any problems, questions or concerns.  The total time spent in the appt was 20 minutes and more than 50% was on counseling and direct patient cares.     Sullivan Lone MD MS AAHIVMS [facility] Hematology/Oncology Physician [facility]  (Office):       [phone] (Work cell):  [phone] (Fax):           [phone]  01/14/2019 12:31 PM  I, Baldwin [place], am acting as a scribe for Dr. Sullivan Lone.   .I have reviewed the above documentation for accuracy and completeness, and I agree with the above. Brunetta Genera MD

## 2019-01-14 ENCOUNTER — Inpatient Hospital Stay: Payer: Medicare HMO | Admitting: Hematology

## 2019-01-14 ENCOUNTER — Telehealth: Payer: Self-pay | Admitting: Hematology

## 2019-01-14 ENCOUNTER — Other Ambulatory Visit: Payer: Self-pay

## 2019-01-14 VITALS — BP 128/85 | HR 105 | Temp 98.4°F | Resp 18 | Ht 64.0 in | Wt 234.3 lb

## 2019-01-14 DIAGNOSIS — R911 Solitary pulmonary nodule: Secondary | ICD-10-CM

## 2019-01-14 DIAGNOSIS — R918 Other nonspecific abnormal finding of lung field: Secondary | ICD-10-CM

## 2019-01-14 DIAGNOSIS — C9110 Chronic lymphocytic leukemia of B-cell type not having achieved remission: Secondary | ICD-10-CM

## 2019-01-14 DIAGNOSIS — D72829 Elevated white blood cell count, unspecified: Secondary | ICD-10-CM | POA: Diagnosis not present

## 2019-01-14 NOTE — Telephone Encounter (Signed)
Scheduled appt per 3/17 los.  Patient wanted CT, lab and f/u all close together and not a week apart.  Printed calendar and avs,

## 2019-01-15 DIAGNOSIS — I10 Essential (primary) hypertension: Secondary | ICD-10-CM | POA: Diagnosis not present

## 2019-01-15 DIAGNOSIS — E1169 Type 2 diabetes mellitus with other specified complication: Secondary | ICD-10-CM | POA: Diagnosis not present

## 2019-01-15 DIAGNOSIS — E782 Mixed hyperlipidemia: Secondary | ICD-10-CM | POA: Diagnosis not present

## 2019-01-15 DIAGNOSIS — D649 Anemia, unspecified: Secondary | ICD-10-CM | POA: Diagnosis not present

## 2019-01-15 DIAGNOSIS — M5430 Sciatica, unspecified side: Secondary | ICD-10-CM | POA: Diagnosis not present

## 2019-02-27 DIAGNOSIS — D649 Anemia, unspecified: Secondary | ICD-10-CM | POA: Diagnosis not present

## 2019-02-27 DIAGNOSIS — E782 Mixed hyperlipidemia: Secondary | ICD-10-CM | POA: Diagnosis not present

## 2019-02-27 DIAGNOSIS — E1169 Type 2 diabetes mellitus with other specified complication: Secondary | ICD-10-CM | POA: Diagnosis not present

## 2019-02-27 DIAGNOSIS — I10 Essential (primary) hypertension: Secondary | ICD-10-CM | POA: Diagnosis not present

## 2019-03-14 DIAGNOSIS — R69 Illness, unspecified: Secondary | ICD-10-CM | POA: Diagnosis not present

## 2019-03-21 DIAGNOSIS — D649 Anemia, unspecified: Secondary | ICD-10-CM | POA: Diagnosis not present

## 2019-03-21 DIAGNOSIS — E1169 Type 2 diabetes mellitus with other specified complication: Secondary | ICD-10-CM | POA: Diagnosis not present

## 2019-03-21 DIAGNOSIS — I10 Essential (primary) hypertension: Secondary | ICD-10-CM | POA: Diagnosis not present

## 2019-03-21 DIAGNOSIS — E782 Mixed hyperlipidemia: Secondary | ICD-10-CM | POA: Diagnosis not present

## 2019-05-09 DIAGNOSIS — Z794 Long term (current) use of insulin: Secondary | ICD-10-CM | POA: Diagnosis not present

## 2019-05-09 DIAGNOSIS — H5213 Myopia, bilateral: Secondary | ICD-10-CM | POA: Diagnosis not present

## 2019-05-09 DIAGNOSIS — H25013 Cortical age-related cataract, bilateral: Secondary | ICD-10-CM | POA: Diagnosis not present

## 2019-05-09 DIAGNOSIS — H52203 Unspecified astigmatism, bilateral: Secondary | ICD-10-CM | POA: Diagnosis not present

## 2019-05-09 DIAGNOSIS — H2513 Age-related nuclear cataract, bilateral: Secondary | ICD-10-CM | POA: Diagnosis not present

## 2019-05-09 DIAGNOSIS — H524 Presbyopia: Secondary | ICD-10-CM | POA: Diagnosis not present

## 2019-05-09 DIAGNOSIS — E119 Type 2 diabetes mellitus without complications: Secondary | ICD-10-CM | POA: Diagnosis not present

## 2019-05-28 DIAGNOSIS — H25011 Cortical age-related cataract, right eye: Secondary | ICD-10-CM | POA: Diagnosis not present

## 2019-05-28 DIAGNOSIS — H2512 Age-related nuclear cataract, left eye: Secondary | ICD-10-CM | POA: Diagnosis not present

## 2019-05-28 DIAGNOSIS — H2511 Age-related nuclear cataract, right eye: Secondary | ICD-10-CM | POA: Diagnosis not present

## 2019-05-28 DIAGNOSIS — H25012 Cortical age-related cataract, left eye: Secondary | ICD-10-CM | POA: Diagnosis not present

## 2019-06-04 DIAGNOSIS — R69 Illness, unspecified: Secondary | ICD-10-CM | POA: Diagnosis not present

## 2019-06-04 DIAGNOSIS — H25012 Cortical age-related cataract, left eye: Secondary | ICD-10-CM | POA: Diagnosis not present

## 2019-06-04 DIAGNOSIS — H2512 Age-related nuclear cataract, left eye: Secondary | ICD-10-CM | POA: Diagnosis not present

## 2019-06-09 DIAGNOSIS — D649 Anemia, unspecified: Secondary | ICD-10-CM | POA: Diagnosis not present

## 2019-06-09 DIAGNOSIS — E782 Mixed hyperlipidemia: Secondary | ICD-10-CM | POA: Diagnosis not present

## 2019-06-09 DIAGNOSIS — E1169 Type 2 diabetes mellitus with other specified complication: Secondary | ICD-10-CM | POA: Diagnosis not present

## 2019-06-09 DIAGNOSIS — I10 Essential (primary) hypertension: Secondary | ICD-10-CM | POA: Diagnosis not present

## 2019-07-02 DIAGNOSIS — Z961 Presence of intraocular lens: Secondary | ICD-10-CM | POA: Diagnosis not present

## 2019-07-04 DIAGNOSIS — R69 Illness, unspecified: Secondary | ICD-10-CM | POA: Diagnosis not present

## 2019-07-14 ENCOUNTER — Telehealth: Payer: Self-pay | Admitting: Hematology

## 2019-07-14 NOTE — Telephone Encounter (Signed)
Called patient regarding pre-screening questions for appointments on 09/15. °

## 2019-07-15 ENCOUNTER — Ambulatory Visit (HOSPITAL_COMMUNITY)
Admission: RE | Admit: 2019-07-15 | Discharge: 2019-07-15 | Disposition: A | Discharge status: Home / Self Care | Payer: Medicare HMO | Source: Ambulatory Visit | Attending: Hematology | Admitting: Hematology

## 2019-07-15 ENCOUNTER — Other Ambulatory Visit: Payer: Self-pay | Admitting: Hematology

## 2019-07-15 ENCOUNTER — Encounter (HOSPITAL_COMMUNITY): Payer: Self-pay

## 2019-07-15 ENCOUNTER — Inpatient Hospital Stay: Payer: Medicare HMO | Attending: Hematology

## 2019-07-15 ENCOUNTER — Other Ambulatory Visit: Payer: Self-pay

## 2019-07-15 DIAGNOSIS — Z87891 Personal history of nicotine dependence: Secondary | ICD-10-CM | POA: Diagnosis not present

## 2019-07-15 DIAGNOSIS — M899 Disorder of bone, unspecified: Secondary | ICD-10-CM

## 2019-07-15 DIAGNOSIS — M25851 Other specified joint disorders, right hip: Secondary | ICD-10-CM | POA: Diagnosis not present

## 2019-07-15 DIAGNOSIS — Z794 Long term (current) use of insulin: Secondary | ICD-10-CM | POA: Insufficient documentation

## 2019-07-15 DIAGNOSIS — C9110 Chronic lymphocytic leukemia of B-cell type not having achieved remission: Secondary | ICD-10-CM | POA: Diagnosis not present

## 2019-07-15 DIAGNOSIS — E119 Type 2 diabetes mellitus without complications: Secondary | ICD-10-CM | POA: Insufficient documentation

## 2019-07-15 DIAGNOSIS — R61 Generalized hyperhidrosis: Secondary | ICD-10-CM | POA: Insufficient documentation

## 2019-07-15 DIAGNOSIS — R918 Other nonspecific abnormal finding of lung field: Secondary | ICD-10-CM | POA: Insufficient documentation

## 2019-07-15 LAB — CMP (CANCER CENTER ONLY)
ALT: 11 U/L (ref 0–44)
AST: 16 U/L (ref 15–41)
Albumin: 4.2 g/dL (ref 3.5–5.0)
Alkaline Phosphatase: 81 U/L (ref 38–126)
Anion gap: 10 (ref 5–15)
BUN: 11 mg/dL (ref 8–23)
CO2: 28 mmol/L (ref 22–32)
Calcium: 9.7 mg/dL (ref 8.9–10.3)
Chloride: 103 mmol/L (ref 98–111)
Creatinine: 0.82 mg/dL (ref 0.44–1.00)
GFR, Est AFR Am: >60 mL/min (ref >60)
GFR, Estimated: >60 mL/min (ref >60)
Glucose, Bld: 109 mg/dL — ABNORMAL HIGH (ref 70–99)
Potassium: 4.1 mmol/L (ref 3.5–5.1)
Sodium: 141 mmol/L (ref 135–145)
Total Bilirubin: 0.4 mg/dL (ref 0.3–1.2)
Total Protein: 7.1 g/dL (ref 6.5–8.1)

## 2019-07-15 LAB — CBC WITH DIFFERENTIAL/PLATELET
Abs Immature Granulocytes: 0.04 10*3/uL (ref 0.00–0.07)
Basophils Absolute: 0.1 10*3/uL (ref 0.0–0.1)
Basophils Relative: 1 %
Eosinophils Absolute: 0.6 10*3/uL — ABNORMAL HIGH (ref 0.0–0.5)
Eosinophils Relative: 4 %
HCT: 41.8 % (ref 36.0–46.0)
Hemoglobin: 13.1 g/dL (ref 12.0–15.0)
Immature Granulocytes: 0 %
Lymphocytes Relative: 38 %
Lymphs Abs: 5.3 10*3/uL — ABNORMAL HIGH (ref 0.7–4.0)
MCH: 26.1 pg (ref 26.0–34.0)
MCHC: 31.3 g/dL (ref 30.0–36.0)
MCV: 83.4 fL (ref 80.0–100.0)
Monocytes Absolute: 1 10*3/uL (ref 0.1–1.0)
Monocytes Relative: 7 %
Neutro Abs: 7 10*3/uL (ref 1.7–7.7)
Neutrophils Relative %: 50 %
Platelets: 284 10*3/uL (ref 150–400)
RBC: 5.01 MIL/uL (ref 3.87–5.11)
RDW: 14.1 % (ref 11.5–15.5)
WBC: 14 10*3/uL — ABNORMAL HIGH (ref 4.0–10.5)
nRBC: 0 % (ref 0.0–0.2)

## 2019-07-15 LAB — LACTATE DEHYDROGENASE: LDH: 162 U/L (ref 98–192)

## 2019-07-15 NOTE — Progress Notes (Signed)
HEMATOLOGY/ONCOLOGY CLINIC NOTE  Date of Service: 07/15/2019  Patient Care Team: Lawerance Cruel, MD as PCP - General (Family Medicine)  CHIEF COMPLAINTS/PURPOSE OF CONSULTATION:  F/u for CLL  HISTORY OF PRESENTING ILLNESS:   Cassandra Palmer is a wonderful 73 y.o. female who has been referred to [place] by Dr. Lawerance Cruel for evaluation and management of Leukocytosis. The pt reports that she is doing well overall. She has had elevated WBC's for the past several years. She notes that she has a hx of elevated leukocytes.  She notes that her blood sugar levels were mildly elevated due to a recent change in her medications due to her insurance not covering these medications. She has been on Metformin for the past 5 years. She has been on Victoza for 1.5 years. She has her labs checked regularly by her PCP. She denies any recent viral infections or flu like symptoms. She denies any recent illnesses within the past 5 years or recent abx use. She has never been on steroids or prednisone. She is UTD with her mammograms and colonoscopy (last one within the last 3 years).   The pt reports associated fatigue, joint stiffness every morning that alleviates with movement, recurrent night sweats at 4-5 AM (clammy sensation). She takes vitamin D and she has had an elevated pulse rate that was evaluated with an echocardiogram with relief of her fatigue. She denies joint pain, abdominal pain, weight loss, mouth sores, and any other symptoms. She denies any other new symptoms in the past 6 months or year. She denies hx of rheumatological symptoms, thyroid issues, blood transfusions, tattoos. She stopped taking Invokana approximately 1-1.5 years ago due to recurrent yeast infections.   Most recent lab results (07/03/18) of CBC w/diff and CMP is as follows: all values are WNL except for WBC at 11.3k, MCV at 80.8, MCH at 26.0, Lymphs abs at 4.1k, Monocytes abs at 900, Glucose at 143.  On PMHx the pt reports  Vitamin D deficiency, Essential HTN, hyperlipidemia associated with DM Type 2, anemia, insomnia, Rosacea, seasonal allergies, and sciatic back pain radiating to right leg. On Social Hx the pt reports she used to smoke cigarettes in her 20's however, she hasn't smoked for the last 50 years. On Family Hx the pt reports Thyroid issues involving her mother and sister. Her mother had a hx of CLL. She notes rheumatic fever on her father's side.   Prior to retiring, she was a Tree surgeon for a [employer]   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 The patient's last visit with [place] was on 01/14/2019. The pt reports that she is doing well overall.  The pt reports no new concerns. She still experiences some pain in her back and has not been as physically active as usual. She saw a cardiologist 3 years ago for tachycardia and had an ECHO, but did not get a stress test. The pt notes that she has night sweats related to her diabetes and denies fevers, chills, chest pain, and SOB.  Of note since the patient's last visit, pt has had CT chest wo contrast completed on 07/15/2019 with results revealing "1. Multiple small pulmonary nodules measuring 3-4 mm in size, nonspecific, but favored to be benign. No follow-up needed if patient is low-risk (and has no known or suspected primary neoplasm). Non-contrast chest CT can be considered in 12 months if patient is high-risk. This recommendation follows the consensus  statement: Guidelines for Management of Incidental Pulmonary NodulesDetected on CT Images: From the Fleischner Society 2017; Radiology 2017; 284:228-243. 2. Aortic atherosclerosis, in addition to left anterior descending coronary artery disease. Please note that although the presence of coronary artery calcium documents the presence of coronary artery disease, the severity of this disease and any potential stenosis cannot be assessed on  this non-gated CT examination. Assessment for potential risk factor modification, dietary therapy or pharmacologic therapy may be warranted, if clinically indicated. 3. Small hiatal hernia."  The pt also had a DG femur completed on 07/15/2019 with results revealing "Faint lucency in the proximal right femoral shaft which corresponds to the lesion seen on prior MRI and PET-CT."  Lab results today (07/17/2019) of CBC w/diff and CMP is as follows: all values are WNL except for WBC at 14.0k, lymphs abs at 5.3k, eosinophils abs at 0.6.  On review of systems, pt reports no new concerns and denies fevers, chills, chest pain, SOB, and any other symptoms.   MEDICAL HISTORY:  Past Medical History:  Diagnosis Date   Diabetes mellitus type 2, uncontrolled ([place])    Hyperlipidemia    Hypertension    Sciatic nerve disease     SURGICAL HISTORY: Past Surgical History:  Procedure Laterality Date   ELBOW SURGERY     KNEE SURGERY      SOCIAL HISTORY: Social History   Socioeconomic History   Marital status: Single    Spouse name: Not on file   Number of children: 0   Years of education: college   Highest education level: Not on file  Occupational History   Not on file  Social Needs   Financial resource strain: Not on file   Food insecurity    Worry: Not on file    Inability: Not on file   Transportation needs    Medical: Not on file    Non-medical: Not on file  Tobacco Use   Smoking status: Former Smoker   Smokeless tobacco: Never Used  Substance and Sexual Activity   Alcohol use: Yes    Alcohol/week: 1.0 - 2.0 standard drinks    Types: 1 - 2 Glasses of wine per week    Comment: Occasional   Drug use: No   Sexual activity: Not Currently  Lifestyle   Physical activity    Days per week: Not on file    Minutes per session: Not on file   Stress: Not on file  Relationships   Social connections    Talks on phone: Not on file    Gets together: Not on file     Attends religious service: Not on file    Active member of club or organization: Not on file    Attends meetings of clubs or organizations: Not on file    Relationship status: Not on file   Intimate partner violence    Fear of current or ex partner: Not on file    Emotionally abused: Not on file    Physically abused: Not on file    Forced sexual activity: Not on file  Other Topics Concern   Not on file  Social History Narrative   Not on file    FAMILY HISTORY: Family History  Problem Relation Age of Onset   Congestive Heart Failure Mother    Hypertension Mother    Diabetes Father    Heart Problems Father    Hyperlipidemia Father    Dementia Father    Congestive Heart Failure Maternal Grandmother  Heart attack Maternal Grandfather    Heart disease Paternal Grandmother     ALLERGIES:  is allergic to invokana [canagliflozin] and actos [pioglitazone].  MEDICATIONS:  Current Outpatient Medications  Medication Sig Dispense Refill   aspirin EC 81 MG tablet Take 81 mg by mouth daily.     carvedilol (COREG) 3.125 MG tablet Take 1 tablet (3.125 mg total) by mouth 2 (two) times daily. 180 tablet 3   celecoxib (CELEBREX) 200 MG capsule Take 200 mg by mouth daily.     cyclobenzaprine (FLEXERIL) 10 MG tablet Take 10 mg by mouth daily as needed for muscle spasms.     ezetimibe-simvastatin (VYTORIN) 10-40 MG tablet Take 1 tablet by mouth daily.     insulin degludec (TRESIBA FLEXTOUCH) 100 UNIT/ML SOPN FlexTouch Pen Inject 30-40 Units into the skin daily.     liraglutide (VICTOZA) 18 MG/3ML SOPN Inject 1.8 mg into the skin daily.     metFORMIN (GLUCOPHAGE) 1000 MG tablet Take 1,000 mg by mouth 2 (two) times daily with a meal.     metroNIDAZOLE (METROCREAM) 0.75 % cream Apply 1 application topically as directed.     niacin (NIASPAN) 1000 MG CR tablet Take 2,000 mg by mouth at bedtime.     traZODone (DESYREL) 100 MG tablet Take 100 mg by mouth at bedtime as needed  for sleep.     No current facility-administered medications for this visit.     REVIEW OF SYSTEMS:    A 10+ POINT REVIEW OF SYSTEMS WAS OBTAINED including neurology, dermatology, psychiatry, cardiac, respiratory, lymph, extremities, GI, GU, Musculoskeletal, constitutional, breasts, reproductive, HEENT.  All pertinent positives are noted in the HPI.  All others are negative.   PHYSICAL EXAMINATION:  There were no vitals filed for this visit. There were no vitals filed for this visit. .There is no height or weight on file to calculate BMI.  GENERAL:alert, in no acute distress and comfortable SKIN: no acute rashes, no significant lesions EYES: conjunctiva are pink and non-injected, sclera anicteric OROPHARYNX: MMM, no exudates, no oropharyngeal erythema or ulceration NECK: supple, no JVD LYMPH:  no palpable lymphadenopathy in the cervical, axillary or inguinal regions LUNGS: clear to auscultation b/l with normal respiratory effort HEART: regular rate & rhythm ABDOMEN:  normoactive bowel sounds, non tender, not distended. No palpable hepatosplenomegaly.  Extremity: no pedal edema PSYCH: alert & oriented x 3 with fluent speech NEURO: no focal motor/sensory deficits   LABORATORY DATA:  I have reviewed the data as listed  . CBC Latest Ref Rng & Units 07/15/2019 01/10/2019 08/05/2018  WBC 4.0 - 10.5 K/uL 14.0(H) 12.2(H) 13.9(H)  Hemoglobin 12.0 - 15.0 g/dL 13.1 13.0 13.0  Hematocrit 36.0 - 46.0 % 41.8 42.5 41.0  Platelets 150 - 400 K/uL 284 262 278   . CBC    Component Value Date/Time   WBC 14.0 (H) 07/15/2019 0933   RBC 5.01 07/15/2019 0933   HGB 13.1 07/15/2019 0933   HCT 41.8 07/15/2019 0933   PLT 284 07/15/2019 0933   MCV 83.4 07/15/2019 0933   MCH 26.1 07/15/2019 0933   MCHC 31.3 07/15/2019 0933   RDW 14.1 07/15/2019 0933   LYMPHSABS 5.3 (H) 07/15/2019 0933   MONOABS 1.0 07/15/2019 0933   EOSABS 0.6 (H) 07/15/2019 0933   BASOSABS 0.1 07/15/2019 0933     . CMP  Latest Ref Rng & Units 07/15/2019 01/10/2019 08/05/2018  Glucose 70 - 99 mg/dL 109(H) 106(H) 114(H)  BUN 8 - 23 mg/dL 11 14 21   Creatinine 0.44 -  1.00 mg/dL 0.82 0.80 0.99  Sodium 135 - 145 mmol/L 141 140 140  Potassium 3.5 - 5.1 mmol/L 4.1 4.5 4.2  Chloride 98 - 111 mmol/L 103 103 102  CO2 22 - 32 mmol/L 28 26 25   Calcium 8.9 - 10.3 mg/dL 9.7 10.1 10.1  Total Protein 6.5 - 8.1 g/dL 7.1 7.0 7.6  Total Bilirubin 0.3 - 1.2 mg/dL 0.4 0.4 0.4  Alkaline Phos 38 - 126 U/L 81 83 75  AST 15 - 41 U/L 16 18 19   ALT 0 - 44 U/L 11 14 15    Component     Latest Ref Rng & Units 08/05/2018  Hep B Core Ab, Tot     Negative Negative  Hepatitis B Surface Ag     Negative Negative  HCV Ab     0.0 - 0.9 s/co ratio <0.1  LDH     98 - 192 U/L 175   09/09/18 CLL FISH:      07/03/18 CBC w/diff:    RADIOGRAPHIC STUDIES: I have personally reviewed the radiological images as listed and agreed with the findings in the report. Ct Chest Wo Contrast  Result Date: 07/15/2019 CLINICAL DATA:  73 year old female with history of pulmonary nodule. EXAM: CT CHEST WITHOUT CONTRAST TECHNIQUE: Multidetector CT imaging of the chest was performed following the standard protocol without IV contrast. COMPARISON:  No priors. FINDINGS: Cardiovascular: Heart size is normal. There is no significant pericardial fluid, thickening or pericardial calcification. There is aortic atherosclerosis, as well as atherosclerosis of the great vessels of the mediastinum and the coronary arteries, including calcified atherosclerotic plaque in the left anterior descending coronary artery. Mediastinum/Nodes: No pathologically enlarged mediastinal or hilar lymph nodes. Please note that accurate exclusion of hilar adenopathy is limited on noncontrast CT scans. Small hiatal hernia. No axillary lymphadenopathy. Lungs/Pleura: Multiple small 3-4 mm pulmonary nodules in the lungs bilaterally, nonspecific. No other larger more suspicious appearing pulmonary  nodules or masses are noted. No acute consolidative airspace disease. No pleural effusions. Upper Abdomen: Aortic atherosclerosis. Musculoskeletal: There are no aggressive appearing lytic or blastic lesions noted in the visualized portions of the skeleton. IMPRESSION: 1. Multiple small pulmonary nodules measuring 3-4 mm in size, nonspecific, but favored to be benign. No follow-up needed if patient is low-risk (and has no known or suspected primary neoplasm). Non-contrast chest CT can be considered in 12 months if patient is high-risk. This recommendation follows the consensus statement: Guidelines for Management of Incidental Pulmonary Nodules Detected on CT Images: From the Fleischner Society 2017; Radiology 2017; 284:228-243. 2. Aortic atherosclerosis, in addition to left anterior descending coronary artery disease. Please note that although the presence of coronary artery calcium documents the presence of coronary artery disease, the severity of this disease and any potential stenosis cannot be assessed on this non-gated CT examination. Assessment for potential risk factor modification, dietary therapy or pharmacologic therapy may be warranted, if clinically indicated. 3. Small hiatal hernia. Aortic Atherosclerosis (ICD10-I70.0). Electronically Signed   By: Vinnie Langton M.D.   On: 07/15/2019 13:45    ASSESSMENT & PLAN:  73 y.o. female with  1. 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  08/05/18 Peripheral Blood Flow cytometry revealed CD5 positive monoclonal B-cell population at comprising 49% of total lymphocytes  08/05/18 Hep B and Hep C serologies neg   09/09/18 FISH CLL Prognostic panel revealed 17% of cells with mono-allelic 0000000 deletion, [id number] of cells with bi-allelic 0000000 deletion, and 49% cells  with normal nuclei.   09/18/18 PET/CT revealed Focal hypermetabolism in the proximal right femoral metaphysis without discrete bone lesion  on the CT images, suspicious for osseous lymphoma. 2. No additional metabolic evidence of lymphoma. No hypermetabolic lymphadenopathy. Normal size non-hypermetabolic spleen. 3. Few scattered solid pulmonary nodules, largest 7 mm, below PET resolution. Recommend attention on follow-up chest CT in 3-6 months. 4. Chronic findings include: Aortic Atherosclerosis. Coronary atherosclerosis. Small hiatal hernia. Moderate colonic diverticulosis. Cholelithiasis. Myomatous uterus.   10/12/18 MRI Femur Right revealed Signal in the region of increased activity on PET scan correlates with red marrow but is subtly more focal and decreased on T1 weighted imaging compared to other imaged bones. Given activity on PET, the finding is worrisome for focal lymphoma involvement.  07/15/2019 CT chest wo contrast revealed "1. Multiple small pulmonary nodules measuring 3-4 mm in size, nonspecific, but favored to be benign. No follow-up needed if patient is low-risk (and has no known or suspected primary neoplasm). Non-contrast chest CT can be considered in 12 months if patient is high-risk. This recommendation follows the consensus statement: Guidelines for Management of Incidental Pulmonary NodulesDetected on CT Images: From the Fleischner Society 2017; Radiology 2017; 284:228-243. 2. Aortic atherosclerosis, in addition to left anterior descending coronary artery disease. Please note that although the presence of coronary artery calcium documents the presence of coronary artery disease, the severity of this disease and any potential stenosis cannot be assessed on this non-gated CT examination. Assessment for potential risk factor modification, dietary therapy or pharmacologic therapy may be warranted, if clinically indicated. 3. Small hiatal hernia."  07/15/2019 DG femur which revealed "Faint lucency in the proximal right femoral shaft which corresponds to the lesion seen on prior MRI and PET-CT."  PLAN  -Discussed pt labwork  today, 07/17/2019; white counts have been stable over the past year, blood chemistries are stable, LDH is normal -Discussed 07/15/2019 DG femur which revealed "Faint lucency in the proximal right femoral shaft which corresponds to the lesion seen on prior MRI and PET-CT."  -Discussed that the patient's labs indicate monoclonal B lymphocytosis, but scans revealed a faint lucuency on her femur. Would not recommend biopsy at this time. -Discussed 07/15/2019 CT which revealed benign appearing, stable 3-29mm lung nodules. -The pt shows no overt clinical or lab progression of her CLL at this time.  -No indication to initiate treatment at this time.  -Recommend F/U with PCP in 2 weeks as scheduled to optimize coronary risk management. Consider stress test if symptoms arise. -Recommended staying up to date with annual flu vaccine and the every 5 year pneumonia vaccines Prevnar and Pneumovax -Will see the pt back in 6 months. If labs are stable, will space out future visits to every year.   RTC with Dr Irene Limbo with labs in 6 months   All of the patients questions were answered with apparent satisfaction. The patient knows to call the clinic with any problems, questions or concerns.  The total time spent in the appt was 20 minutes and more than 50% was on counseling and direct patient cares.   Sullivan Lone MD MS AAHIVMS [facility] Hematology/Oncology [facility]  (Office):       [phone] (Work cell):  [phone] (Fax):           [phone]  07/15/2019 3:20 PM  I, De Burrs, am acting as a scribe for Dr. Irene Limbo  .I have reviewed the above documentation for accuracy and completeness, and I agree with the above. Suzan Slick  Juleen China MD

## 2019-07-17 ENCOUNTER — Other Ambulatory Visit: Payer: Self-pay

## 2019-07-17 ENCOUNTER — Inpatient Hospital Stay (HOSPITAL_BASED_OUTPATIENT_CLINIC_OR_DEPARTMENT_OTHER): Payer: Medicare HMO | Admitting: Hematology

## 2019-07-17 VITALS — BP 122/62 | HR 86 | Temp 97.8°F | Resp 18 | Ht 64.0 in | Wt 237.4 lb

## 2019-07-17 DIAGNOSIS — Z794 Long term (current) use of insulin: Secondary | ICD-10-CM | POA: Diagnosis not present

## 2019-07-17 DIAGNOSIS — R61 Generalized hyperhidrosis: Secondary | ICD-10-CM | POA: Diagnosis not present

## 2019-07-17 DIAGNOSIS — E119 Type 2 diabetes mellitus without complications: Secondary | ICD-10-CM | POA: Diagnosis not present

## 2019-07-17 DIAGNOSIS — M899 Disorder of bone, unspecified: Secondary | ICD-10-CM

## 2019-07-17 DIAGNOSIS — R918 Other nonspecific abnormal finding of lung field: Secondary | ICD-10-CM | POA: Diagnosis not present

## 2019-07-17 DIAGNOSIS — Z87891 Personal history of nicotine dependence: Secondary | ICD-10-CM | POA: Diagnosis not present

## 2019-07-17 DIAGNOSIS — C9110 Chronic lymphocytic leukemia of B-cell type not having achieved remission: Secondary | ICD-10-CM

## 2019-07-23 DIAGNOSIS — E782 Mixed hyperlipidemia: Secondary | ICD-10-CM | POA: Diagnosis not present

## 2019-07-23 DIAGNOSIS — Z6841 Body Mass Index (BMI) 40.0 and over, adult: Secondary | ICD-10-CM | POA: Diagnosis not present

## 2019-07-23 DIAGNOSIS — E1169 Type 2 diabetes mellitus with other specified complication: Secondary | ICD-10-CM | POA: Diagnosis not present

## 2019-07-23 DIAGNOSIS — Z794 Long term (current) use of insulin: Secondary | ICD-10-CM | POA: Diagnosis not present

## 2019-07-23 DIAGNOSIS — E559 Vitamin D deficiency, unspecified: Secondary | ICD-10-CM | POA: Diagnosis not present

## 2019-07-23 DIAGNOSIS — I10 Essential (primary) hypertension: Secondary | ICD-10-CM | POA: Diagnosis not present

## 2019-07-24 DIAGNOSIS — D649 Anemia, unspecified: Secondary | ICD-10-CM | POA: Diagnosis not present

## 2019-07-24 DIAGNOSIS — E782 Mixed hyperlipidemia: Secondary | ICD-10-CM | POA: Diagnosis not present

## 2019-07-24 DIAGNOSIS — E039 Hypothyroidism, unspecified: Secondary | ICD-10-CM | POA: Diagnosis not present

## 2019-07-24 DIAGNOSIS — I10 Essential (primary) hypertension: Secondary | ICD-10-CM | POA: Diagnosis not present

## 2019-07-24 DIAGNOSIS — E1169 Type 2 diabetes mellitus with other specified complication: Secondary | ICD-10-CM | POA: Diagnosis not present

## 2019-07-29 DIAGNOSIS — J309 Allergic rhinitis, unspecified: Secondary | ICD-10-CM | POA: Diagnosis not present

## 2019-07-29 DIAGNOSIS — E1169 Type 2 diabetes mellitus with other specified complication: Secondary | ICD-10-CM | POA: Diagnosis not present

## 2019-07-29 DIAGNOSIS — E782 Mixed hyperlipidemia: Secondary | ICD-10-CM | POA: Diagnosis not present

## 2019-07-29 DIAGNOSIS — E559 Vitamin D deficiency, unspecified: Secondary | ICD-10-CM | POA: Diagnosis not present

## 2019-07-29 DIAGNOSIS — M5430 Sciatica, unspecified side: Secondary | ICD-10-CM | POA: Diagnosis not present

## 2019-07-29 DIAGNOSIS — Z23 Encounter for immunization: Secondary | ICD-10-CM | POA: Diagnosis not present

## 2019-07-29 DIAGNOSIS — I10 Essential (primary) hypertension: Secondary | ICD-10-CM | POA: Diagnosis not present

## 2019-07-29 DIAGNOSIS — E039 Hypothyroidism, unspecified: Secondary | ICD-10-CM | POA: Diagnosis not present

## 2019-07-29 DIAGNOSIS — Z0000 Encounter for general adult medical examination without abnormal findings: Secondary | ICD-10-CM | POA: Diagnosis not present

## 2019-08-13 DIAGNOSIS — R69 Illness, unspecified: Secondary | ICD-10-CM | POA: Diagnosis not present

## 2019-08-25 DIAGNOSIS — R69 Illness, unspecified: Secondary | ICD-10-CM | POA: Diagnosis not present

## 2019-08-27 DIAGNOSIS — E039 Hypothyroidism, unspecified: Secondary | ICD-10-CM | POA: Diagnosis not present

## 2019-08-27 DIAGNOSIS — E1169 Type 2 diabetes mellitus with other specified complication: Secondary | ICD-10-CM | POA: Diagnosis not present

## 2019-08-27 DIAGNOSIS — E782 Mixed hyperlipidemia: Secondary | ICD-10-CM | POA: Diagnosis not present

## 2019-08-27 DIAGNOSIS — D649 Anemia, unspecified: Secondary | ICD-10-CM | POA: Diagnosis not present

## 2019-08-27 DIAGNOSIS — I10 Essential (primary) hypertension: Secondary | ICD-10-CM | POA: Diagnosis not present

## 2019-09-12 DIAGNOSIS — I10 Essential (primary) hypertension: Secondary | ICD-10-CM | POA: Diagnosis not present

## 2019-09-12 DIAGNOSIS — E1169 Type 2 diabetes mellitus with other specified complication: Secondary | ICD-10-CM | POA: Diagnosis not present

## 2019-09-12 DIAGNOSIS — D649 Anemia, unspecified: Secondary | ICD-10-CM | POA: Diagnosis not present

## 2019-09-12 DIAGNOSIS — E782 Mixed hyperlipidemia: Secondary | ICD-10-CM | POA: Diagnosis not present

## 2019-09-12 DIAGNOSIS — E039 Hypothyroidism, unspecified: Secondary | ICD-10-CM | POA: Diagnosis not present

## 2019-09-17 ENCOUNTER — Ambulatory Visit (INDEPENDENT_AMBULATORY_CARE_PROVIDER_SITE_OTHER): Payer: Medicare HMO | Admitting: Bariatrics

## 2019-09-17 ENCOUNTER — Other Ambulatory Visit: Payer: Self-pay

## 2019-09-17 ENCOUNTER — Encounter: Payer: Self-pay | Admitting: Bariatrics

## 2019-09-17 ENCOUNTER — Encounter (INDEPENDENT_AMBULATORY_CARE_PROVIDER_SITE_OTHER): Payer: Self-pay | Admitting: Bariatrics

## 2019-09-17 VITALS — BP 153/75 | HR 75 | Temp 98.2°F | Ht 64.0 in | Wt 239.0 lb

## 2019-09-17 DIAGNOSIS — E119 Type 2 diabetes mellitus without complications: Secondary | ICD-10-CM | POA: Diagnosis not present

## 2019-09-17 DIAGNOSIS — Z1331 Encounter for screening for depression: Secondary | ICD-10-CM | POA: Diagnosis not present

## 2019-09-17 DIAGNOSIS — R0602 Shortness of breath: Secondary | ICD-10-CM

## 2019-09-17 DIAGNOSIS — Z0289 Encounter for other administrative examinations: Secondary | ICD-10-CM

## 2019-09-17 DIAGNOSIS — R5383 Other fatigue: Secondary | ICD-10-CM | POA: Diagnosis not present

## 2019-09-17 DIAGNOSIS — I10 Essential (primary) hypertension: Secondary | ICD-10-CM | POA: Diagnosis not present

## 2019-09-17 DIAGNOSIS — E038 Other specified hypothyroidism: Secondary | ICD-10-CM

## 2019-09-17 DIAGNOSIS — Z6841 Body Mass Index (BMI) 40.0 and over, adult: Secondary | ICD-10-CM

## 2019-09-17 NOTE — Progress Notes (Signed)
Office: [phone]  /  Fax: [phone]   Dear Dr. Melina Palmer,   Thank you for referring Cassandra Palmer to our clinic. The following note includes my evaluation and treatment recommendations.  HPI:   Chief Complaint: Cassandra Palmer has been referred by Dr. Melina Palmer for consultation regarding her obesity and obesity related comorbidities.    Cassandra Palmer (MR# [id number]) is a 73 y.o. female who presents on 09/17/2019 for obesity evaluation and treatment. Current BMI is Body mass index is 41.02 kg/m.Cassandra Palmer has been struggling with her weight for many years and has been unsuccessful in either losing weight, maintaining weight loss, or reaching her healthy weight goal.     Cassandra Palmer states she is currently in the action stage of change and ready to dedicate time achieving and maintaining a healthier weight. Cassandra Palmer is interested in becoming our patient and working on intensive lifestyle modifications including (but not limited to) diet, exercise and weight loss.     Cassandra Palmer states that she likes to cook, but notes obstacles as being alone and freezing food. She craves sweets. She usually skips breakfast. She is seeing an orthopedist (increased mobility).    Cassandra Palmer states her desired weight loss is 79 lbs she started gaining weight post menopause her heaviest weight ever was 255 lbs. she snacks frequently in the evenings she skips breakfast frequently she frequently eats larger portions than normal  she has binge eating behaviors she struggles with emotional eating    Fatigue Cassandra Palmer feels her energy is lower than it should be. This has worsened with weight gain and has worsened recently. Cassandra Palmer denies daytime somnolence and  denies waking up still tired. Patient generally gets 7 hours of sleep per night, and states they generally have restful sleep. Snoring is not present. Apneic episodes are not present. Epworth Sleepiness Score is 3.  Dyspnea  on exertion Cassandra Palmer notes increasing shortness of breath with exercising and seems to be worsening over time with weight gain. She notes getting out of breath sooner with activity than she used to. This has gotten worse recently. Cassandra Palmer denies orthopnea.  Diabetes II Cassandra Palmer has a diagnosis of diabetes type II and is taking Victoza, metformin, and [place]. Cassandra Palmer states fasting blood sugars range between 75 and 110; 2-hour postprandial 200. Last A1c was reported to be 6.8. She has been working on intensive lifestyle modifications including diet, exercise, and weight loss to help control her blood glucose levels.  Elevated Cholesterol Cassandra Palmer has elevated cholesterol, which is well controlled. She is taking Vytorin.  Hypertension Cassandra Palmer is a 73 y.o. female with hypertension and is taking Coreg. Cassandra Palmer denies chest pain or headache. She is working weight loss to help control her blood pressure with the goal of decreasing her risk of heart attack and stroke.  Hypothyroidism Cassandra Palmer has a diagnosis of hypothyroidism. She is on synthroid 50 mcg. TSH was reported to be 6.49. She denies hot or cold intolerance or palpitations, but does admit to ongoing fatigue.  Depression Screen Cassandra Palmer's Food and Mood (modified PHQ-9) score was 3. Depression screen PHQ 2/9 09/17/2019  Decreased Interest 1  Down, Depressed, Hopeless 1  PHQ - 2 Score 2  Altered sleeping 0  Tired, decreased energy 1  Change in appetite 0  Feeling bad or failure about yourself  0  Trouble concentrating 0  Moving slowly or fidgety/restless 0  Suicidal thoughts 0  PHQ-9 Score 3  Difficult doing work/chores Not difficult at all  ASSESSMENT AND PLAN:  Other fatigue - Plan: EKG 12-Lead  Shortness of breath on exertion  Essential hypertension  Other specified hypothyroidism - Plan: T3, T4, free, TSH  Type 2 diabetes mellitus without complication, without long-term current use of insulin (HCC)   Depression screening  Class 3 severe obesity with serious comorbidity and body mass index (BMI) of 40.0 to 44.9 in adult, unspecified obesity type ([place])  PLAN:  Fatigue Cassandra Palmer was informed that her fatigue may be related to obesity, depression or many other causes. Labs will be ordered, and in the meanwhile Cassandra Palmer has agreed to work on diet, exercise and weight loss to help with fatigue. Proper sleep hygiene was discussed including the need for 7-8 hours of quality sleep each night. A sleep study was not ordered based on symptoms and Epworth score. Cassandra Palmer will gradually increase activities.  Dyspnea on exertion Cassandra Palmer's shortness of breath appears to be obesity related and exercise induced. She has agreed to work on weight loss and gradually increase exercise to treat her exercise induced shortness of breath. If Cassandra Palmer follows our instructions and loses weight without improvement of her shortness of breath, we will plan to refer to pulmonology. We will monitor this condition regularly. Cassandra Palmer agrees to this plan.  Diabetes II Cassandra Palmer has been given extensive diabetes education by myself today including ideal fasting and post-prandial blood glucose readings, individual ideal HgA1c goals  and hypoglycemia prevention. We discussed the importance of good blood sugar control to decrease the likelihood of diabetic complications such as nephropathy, neuropathy, limb loss, blindness, coronary artery disease, and death. We discussed the importance of intensive lifestyle modification including diet, exercise and weight loss as the first line treatment for diabetes. Cassandra Palmer will decrease carbohydrates, increase protein and healthy fats. Will consider decreasing [place].  Elevated Cholesterol Cassandra Palmer will decrease saturated fats. She will increase PUFA's and MUFA's.  Hypertension We discussed sodium restriction, working on healthy weight loss, and a regular exercise program as the means to achieve  improved blood pressure control. Cassandra Palmer agreed with this plan and agreed to follow up as directed. We will continue to monitor her blood pressure as well as her progress with the above lifestyle modifications. She will continue her medications as prescribed and will watch for signs of hypotension as she continues her lifestyle modifications.  Hypothyroidism Mackensie was informed of the importance of good thyroid control to help with weight loss efforts. She was also informed that supertheraputic thyroid levels are dangerous and will not improve weight loss results. Brienne will follow-up with her PCP.  Depression Screen Cassandra Palmer had a negative depression screening. Depression is commonly associated with obesity and often results in emotional eating behaviors. We will monitor this closely and work on CBT to help improve the non-hunger eating patterns. Referral to Psychology may be required if no improvement is seen as she continues in our clinic.  Obesity Cassandra Palmer is currently in the action stage of change and her goal is to continue with weight loss efforts. I recommend Cassandra Palmer begin the structured treatment plan as follows:  She has agreed to follow the Category 2 plan. Cassandra Palmer will work on [employer] and will work on portion size.  Cassandra Palmer has been instructed to eventually work up to a goal of 150 minutes of combined cardio and strengthening exercise per week for weight loss and overall health benefits. We discussed the following Behavioral Modification Strategies today: increasing lean protein intake, decreasing simple carbohydrates, increasing vegetables, increase H20 intake, decrease liquid calories, travel eating  strategies, holiday eating strategies, and celebration eating strategies.   She was informed of the importance of frequent follow-up visits to maximize her success with intensive lifestyle modifications for her multiple health conditions. She was informed we would discuss her lab  results at her next visit unless there is a critical issue that needs to be addressed sooner. Cassandra Palmer agreed to keep her next visit at the agreed upon time to discuss these results.  ALLERGIES: Allergies  Allergen Reactions  . Invokana [Canagliflozin] Other (See Comments)    Yeast infections  . Actos [Pioglitazone] Swelling  . Tinactin Foot-Sneaker De[employer]]     MEDICATIONS: Current Outpatient Medications on File Prior to Visit  Medication Sig Dispense Refill  . aspirin 325 MG tablet Take 325 mg by mouth daily.    . celecoxib (CELEBREX) 200 MG capsule Take 200 mg by mouth daily.    . cetirizine (ZYRTEC) 10 MG tablet Take 10 mg by mouth daily.    . Cholecalciferol (VITAMIN D3) 1.25 MG (50000 UT) CAPS Take by mouth.    . cyclobenzaprine (FLEXERIL) 10 MG tablet Take 10 mg by mouth daily as needed for muscle spasms.    Marland Kitchen ezetimibe-simvastatin (VYTORIN) 10-40 MG tablet Take 1 tablet by mouth daily.    . furosemide (LASIX) 40 MG tablet Take 40 mg by mouth.    Marland Kitchen glucose blood test strip 1 each by Other route as needed for other. Use as instructed    . insulin degludec (TRESIBA FLEXTOUCH) 100 UNIT/ML SOPN FlexTouch Pen Inject 30-40 Units into the skin daily.    Marland Kitchen levothyroxine (SYNTHROID) 50 MCG tablet Take 50 mcg by mouth daily before breakfast.    . liraglutide (VICTOZA) 18 MG/3ML SOPN Inject 1.8 mg into the skin daily.    . metFORMIN (GLUCOPHAGE) 1000 MG tablet Take 1,000 mg by mouth 2 (two) times daily with a meal.    . montelukast (SINGULAIR) 10 MG tablet Take 10 mg by mouth at bedtime.    . niacin (NIASPAN) 1000 MG CR tablet Take 2,000 mg by mouth at bedtime.    . traZODone (DESYREL) 100 MG tablet Take 100 mg by mouth at bedtime as needed for sleep.    . carvedilol (COREG) 3.125 MG tablet Take 1 tablet (3.125 mg total) by mouth 2 (two) times daily. 180 tablet 3  . metroNIDAZOLE (METROCREAM) 0.75 % cream Apply 1 application topically as directed.     No current  facility-administered medications on file prior to visit.     PAST MEDICAL HISTORY: Past Medical History:  Diagnosis Date  . Anemia   . Back pain   . CLL (chronic lymphocytic leukemia) ([place])   . Diabetes mellitus type 2, uncontrolled ([place])   . Edema of both lower extremities   . Hyperlipidemia   . Hypertension   . Hypothyroidism   . Joint pain   . Lumbar spondylolysis   . Obesity   . Sciatic nerve disease   . SOB (shortness of breath)   . Vitamin D deficiency     PAST SURGICAL HISTORY: Past Surgical History:  Procedure Laterality Date  . ELBOW SURGERY    . KNEE SURGERY    . TONSILLECTOMY AND ADENOIDECTOMY      SOCIAL HISTORY: Social History   Tobacco Use  . Smoking status: Former Research scientist (life sciences)  . Smokeless tobacco: Never Used  Substance Use Topics  . Alcohol use: Yes    Alcohol/week: 1.0 - 2.0 standard drinks    Types: 1 - 2 Glasses  of wine per week    Comment: Occasional  . Drug use: No    FAMILY HISTORY: Family History  Problem Relation Age of Onset  . Congestive Heart Failure Mother   . Hypertension Mother   . Thyroid disease Mother   . Cancer Mother   . Anxiety disorder Mother   . Diabetes Father   . Heart Problems Father   . Hyperlipidemia Father   . Dementia Father   . Thyroid disease Father   . Cancer Father   . Congestive Heart Failure Maternal Grandmother   . Heart attack Maternal Grandfather   . Heart disease Paternal Grandmother    ROS: Review of Systems  Constitutional: Positive for malaise/fatigue.  HENT: Positive for sinus pain.        Positive for stuffiness.  Eyes:       Positive for cataract surgery.  Respiratory: Positive for shortness of breath (with activity).   Cardiovascular: Negative for chest pain, palpitations and orthopnea.  Musculoskeletal: Positive for back pain and joint pain.       Positive for muscle stiffness.  Skin:       Positive for skin dryness.  Neurological: Positive for headaches (sinus).  Endo/Heme/Allergies:  Bruises/bleeds easily.       Negative for hot/cold intolerance.   PHYSICAL EXAM: Blood pressure (!) 153/75, pulse 75, temperature 98.2 F (36.8 C), height 5\' 4"  (1.626 m), weight 239 lb (108.4 kg), SpO2 98 %. Body mass index is 41.02 kg/m. Physical Exam Vitals signs reviewed.  Constitutional:      Appearance: Normal appearance. She is well-developed. She is obese.  HENT:     Head: Normocephalic and atraumatic.     Nose: Nose normal.  Eyes:     General: No scleral icterus. Neck:     Musculoskeletal: Normal range of motion.  Cardiovascular:     Rate and Rhythm: Normal rate and regular rhythm.  Pulmonary:     Effort: Pulmonary effort is normal. No respiratory distress.  Abdominal:     Palpations: Abdomen is soft.     Tenderness: There is no abdominal tenderness.  Musculoskeletal: Normal range of motion.     Comments: Range of motion normal in all four extremities.  Skin:    General: Skin is warm and dry.  Neurological:     Mental Status: She is alert and oriented to person, place, and time.     Coordination: Coordination normal.  Psychiatric:        Mood and Affect: Mood and affect normal.        Behavior: Behavior normal.   RECENT LABS AND TESTS: BMET    Component Value Date/Time   NA 141 07/15/2019 0933   K 4.1 07/15/2019 0933   CL 103 07/15/2019 0933   CO2 28 07/15/2019 0933   GLUCOSE 109 (H) 07/15/2019 0933   BUN 11 07/15/2019 0933   CREATININE 0.82 07/15/2019 0933   CALCIUM 9.7 07/15/2019 0933   GFRNONAA >60 07/15/2019 0933   GFRAA >60 07/15/2019 0933   No results found for: HGBA1C No results found for: INSULIN CBC    Component Value Date/Time   WBC 14.0 (H) 07/15/2019 0933   RBC 5.01 07/15/2019 0933   HGB 13.1 07/15/2019 0933   HCT 41.8 07/15/2019 0933   PLT 284 07/15/2019 0933   MCV 83.4 07/15/2019 0933   MCH 26.1 07/15/2019 0933   MCHC 31.3 07/15/2019 0933   RDW 14.1 07/15/2019 0933   LYMPHSABS 5.3 (H) 07/15/2019 0933   MONOABS 1.0  07/15/2019  0933   EOSABS 0.6 (H) 07/15/2019 0933   BASOSABS 0.1 07/15/2019 0933   Iron/TIBC/Ferritin/ %Sat No results found for: IRON, TIBC, FERRITIN, IRONPCTSAT Lipid Panel  No results found for: CHOL, TRIG, HDL, CHOLHDL, VLDL, LDLCALC, LDLDIRECT Hepatic Function Panel     Component Value Date/Time   PROT 7.1 07/15/2019 0933   ALBUMIN 4.2 07/15/2019 0933   AST 16 07/15/2019 0933   ALT 11 07/15/2019 0933   ALKPHOS 81 07/15/2019 0933   BILITOT 0.4 07/15/2019 0933   No results found for: TSH   No results found for: Vitamin D, 25-Hydroxy   ECG  shows Sinus  Rhythm with a rate of 75 BPM. Low voltage in precordial leads. Abnormal.    INDIRECT CALORIMETER done today shows a VO2 of 222 and a REE of 1553.  Her calculated basal metabolic rate is [id number] thus her basal metabolic rate is worse than expected.  OBESITY BEHAVIORAL INTERVENTION VISIT  Today's visit was #1  Starting weight: 239 lbs Starting date: 09/17/2019 Today's weight: 239 lbs  Today's date: 09/17/2019 Total lbs lost to date: 0  At least 15 minutes were spent on discussing the following behavioral intervention visit.    09/17/2019  Height 5\' 4"  (1.626 m)  Weight 239 lb (108.4 kg)  BMI (Calculated) 41  BLOOD PRESSURE - SYSTOLIC 0000000  BLOOD PRESSURE - DIASTOLIC 75  Waist Measurement  45 inches   Body Fat % 54 %  Total Body Water (lbs) 83.6 lbs  RMR 1553   ASK: We discussed the diagnosis of obesity with Elmore Guise today and Adoniah agreed to give [place] permission to discuss obesity behavioral modification therapy today.  ASSESS: Carnesha has the diagnosis of obesity and her BMI today is 41.2. Uriyah is in the action stage of change.   ADVISE: Doriane was educated on the multiple health risks of obesity as well as the benefit of weight loss to improve her health. She was advised of the need for long term treatment and the importance of lifestyle modifications to improve her current health and to decrease her risk of  future health problems.  AGREE: Multiple dietary modification options and treatment options were discussed and  Tamanika agreed to follow the recommendations documented in the above note.  ARRANGE: Yadhira was educated on the importance of frequent visits to treat obesity as outlined per CMS and USPSTF guidelines and agreed to schedule her next follow up appointment today.  Migdalia Dk, am acting as Location manager for [employer], DO  I have reviewed the above documentation for accuracy and completeness, and I agree with the above. -Jearld Lesch, DO

## 2019-09-18 LAB — T4, FREE: Free T4: 1.38 ng/dL (ref 0.82–1.77)

## 2019-09-18 LAB — TSH: TSH: 2.89 u[IU]/mL (ref 0.450–4.500)

## 2019-09-18 LAB — T3: T3, Total: 105 ng/dL (ref 71–180)

## 2019-10-01 ENCOUNTER — Other Ambulatory Visit: Payer: Self-pay

## 2019-10-01 ENCOUNTER — Encounter (INDEPENDENT_AMBULATORY_CARE_PROVIDER_SITE_OTHER): Payer: Self-pay | Admitting: Bariatrics

## 2019-10-01 ENCOUNTER — Ambulatory Visit (INDEPENDENT_AMBULATORY_CARE_PROVIDER_SITE_OTHER): Payer: Medicare HMO | Admitting: Bariatrics

## 2019-10-01 VITALS — BP 127/77 | HR 91 | Temp 97.9°F | Ht 64.0 in | Wt 237.0 lb

## 2019-10-01 DIAGNOSIS — E038 Other specified hypothyroidism: Secondary | ICD-10-CM

## 2019-10-01 DIAGNOSIS — I10 Essential (primary) hypertension: Secondary | ICD-10-CM

## 2019-10-01 DIAGNOSIS — Z6841 Body Mass Index (BMI) 40.0 and over, adult: Secondary | ICD-10-CM

## 2019-10-01 DIAGNOSIS — Z794 Long term (current) use of insulin: Secondary | ICD-10-CM

## 2019-10-01 DIAGNOSIS — E119 Type 2 diabetes mellitus without complications: Secondary | ICD-10-CM | POA: Diagnosis not present

## 2019-10-02 ENCOUNTER — Encounter (INDEPENDENT_AMBULATORY_CARE_PROVIDER_SITE_OTHER): Payer: Self-pay | Admitting: Bariatrics

## 2019-10-02 DIAGNOSIS — Z1231 Encounter for screening mammogram for malignant neoplasm of breast: Secondary | ICD-10-CM | POA: Diagnosis not present

## 2019-10-02 DIAGNOSIS — Z961 Presence of intraocular lens: Secondary | ICD-10-CM | POA: Diagnosis not present

## 2019-10-02 DIAGNOSIS — T8529XA Other mechanical complication of intraocular lens, initial encounter: Secondary | ICD-10-CM | POA: Diagnosis not present

## 2019-10-02 NOTE — Progress Notes (Signed)
Office: [phone]  /  Fax: [phone]   HPI:   Chief Complaint: OBESITY Cassandra Palmer is here to discuss her progress with her obesity treatment plan. She is on the Category 2 plan and is following her eating plan approximately 90% of the time. She states she is exercising 0 minutes 0 times per week. Cassandra Palmer is down 2 lbs. She struggled with lunch meat and missing her cantaloupe. She is a huge fruit eater. Her weight is 237 lb (107.5 kg) today and has had a weight loss of 2 pounds over a period of 2 weeks since her last visit. She has lost 2 lbs since starting treatment with [place].  Diabetes II Cassandra Palmer has a diagnosis of diabetes type II and is taking Victoza, metformin, and [place]. Her insulin has decreased from 40 units to 27 units. Cassandra Palmer does not report checking her blood sugars. Last A1c was reported to be 6.8. She has been working on intensive lifestyle modifications including diet, exercise, and weight loss to help control her blood glucose levels.  Hypothyroidism Cassandra Palmer has a diagnosis of hypothyroidism, which is controlled. Thyroid panel normal. She denies hot or cold intolerance or palpitations, but does admit to slight fatigue.  Hypertension Cassandra Palmer is a 73 y.o. female with hypertension.  Cassandra Palmer denies chest pain or shortness of breath on exertion. No lightheadedness. She is working weight loss to help control her blood pressure with the goal of decreasing her risk of heart attack and stroke. Cassandra Palmer's blood pressure is 127/77.   ASSESSMENT AND PLAN:  Type 2 diabetes mellitus without complication, with long-term current use of insulin (HCC)  Other specified hypothyroidism  Essential hypertension  Class 3 severe obesity with serious comorbidity and body mass index (BMI) of 40.0 to 44.9 in adult, unspecified obesity type ([place])  PLAN:  Diabetes II Cassandra Palmer has been given diabetes education by myself including ideal fasting and post-prandial blood  glucose readings, individual ideal HgA1c goals  and hypoglycemia prevention. We discussed the importance of good blood sugar control to decrease the likelihood of diabetic complications such as nephropathy, neuropathy, limb loss, blindness, coronary artery disease, and death. We discussed the importance of intensive lifestyle modification including diet, exercise and weight loss as the first line treatment for diabetes. [name] agrees to continue her diabetes medications and will follow up at the agreed upon time.  Hypothyroidism Cassandra Palmer was informed of the importance of good thyroid control to help with weight loss efforts. She was also informed that supertheraputic thyroid levels are dangerous and will not improve weight loss results. Cassandra Palmer will continue her medications and follow-up as directed.  Hypertension We discussed sodium restriction, working on healthy weight loss, and a regular exercise program as the means to achieve improved blood pressure control. Cassandra Palmer agreed with this plan and agreed to follow up as directed. We will continue to monitor her blood pressure as well as her progress with the above lifestyle modifications. She will continue her medications as prescribed and will watch for signs of hypotension as she continues her lifestyle modifications.  Obesity Cassandra Palmer is currently in the action stage of change. As such, her goal is to continue with weight loss efforts. She has agreed to follow the Category 2 plan and journal 400-500 calories + 35 grams of protein at supper. We reviewed labs (thyroid panel). Cassandra Palmer was given handout on Recipes (soups), Smart Fruit Choices, and Protein Content of Foods. She was instructed to read labels. Cassandra Palmer has been instructed to work up to a goal  of 150 minutes of combined cardio and strengthening exercise per week for weight loss and overall health benefits. We discussed the following Behavioral Modification Strategies today: increasing lean  protein intake, decreasing simple carbohydrates, increasing vegetables, increase H20 intake, decrease eating out, no skipping meals, work on meal planning and easy cooking plans, and keeping healthy foods in the home.  Cassandra Palmer has agreed to follow-up with our clinic in 2-3 weeks. She was informed of the importance of frequent follow-up visits to maximize her success with intensive lifestyle modifications for her multiple health conditions.  ALLERGIES: Allergies  Allergen Reactions   Invokana [Canagliflozin] Other (See Comments)    Yeast infections   Actos [Pioglitazone] Swelling   Tinactin Foot-Sneaker Deodor [[employer]]     MEDICATIONS: Current Outpatient Medications on File Prior to Visit  Medication Sig Dispense Refill   aspirin 325 MG tablet Take 325 mg by mouth daily.     celecoxib (CELEBREX) 200 MG capsule Take 200 mg by mouth daily.     cetirizine (ZYRTEC) 10 MG tablet Take 10 mg by mouth daily.     Cholecalciferol (VITAMIN D3) 1.25 MG (50000 UT) CAPS Take by mouth.     cyclobenzaprine (FLEXERIL) 10 MG tablet Take 10 mg by mouth daily as needed for muscle spasms.     ezetimibe-simvastatin (VYTORIN) 10-40 MG tablet Take 1 tablet by mouth daily.     furosemide (LASIX) 40 MG tablet Take 40 mg by mouth.     glucose blood test strip 1 each by Other route as needed for other. Use as instructed     insulin degludec (TRESIBA FLEXTOUCH) 100 UNIT/ML SOPN FlexTouch Pen Inject 30-40 Units into the skin daily.     levothyroxine (SYNTHROID) 50 MCG tablet Take 50 mcg by mouth daily before breakfast.     liraglutide (VICTOZA) 18 MG/3ML SOPN Inject 1.8 mg into the skin daily.     metFORMIN (GLUCOPHAGE) 1000 MG tablet Take 1,000 mg by mouth 2 (two) times daily with a meal.     metroNIDAZOLE (METROCREAM) 0.75 % cream Apply 1 application topically as directed.     montelukast (SINGULAIR) 10 MG tablet Take 10 mg by mouth at bedtime.     niacin (NIASPAN) 1000 MG CR tablet  Take 2,000 mg by mouth at bedtime.     traZODone (DESYREL) 100 MG tablet Take 100 mg by mouth at bedtime as needed for sleep.     carvedilol (COREG) 3.125 MG tablet Take 1 tablet (3.125 mg total) by mouth 2 (two) times daily. 180 tablet 3   No current facility-administered medications on file prior to visit.     PAST MEDICAL HISTORY: Past Medical History:  Diagnosis Date   Anemia    Back pain    CLL (chronic lymphocytic leukemia) (HCC)    Diabetes mellitus type 2, uncontrolled (HCC)    Edema of both lower extremities    Hyperlipidemia    Hypertension    Hypothyroidism    Joint pain    Lumbar spondylolysis    Obesity    Sciatic nerve disease    SOB (shortness of breath)    Vitamin D deficiency     PAST SURGICAL HISTORY: Past Surgical History:  Procedure Laterality Date   ELBOW SURGERY     KNEE SURGERY     TONSILLECTOMY AND ADENOIDECTOMY      SOCIAL HISTORY: Social History   Tobacco Use   Smoking status: Former Smoker   Smokeless tobacco: Never Used  Substance Use Topics  Alcohol use: Yes    Alcohol/week: 1.0 - 2.0 standard drinks    Types: 1 - 2 Glasses of wine per week    Comment: Occasional   Drug use: No    FAMILY HISTORY: Family History  Problem Relation Age of Onset   Congestive Heart Failure Mother    Hypertension Mother    Thyroid disease Mother    Cancer Mother    Anxiety disorder Mother    Diabetes Father    Heart Problems Father    Hyperlipidemia Father    Dementia Father    Thyroid disease Father    Cancer Father    Congestive Heart Failure Maternal Grandmother    Heart attack Maternal Grandfather    Heart disease Paternal Grandmother    ROS: Review of Systems  Constitutional: Positive for malaise/fatigue (slight).  Respiratory: Negative for shortness of breath.   Cardiovascular: Negative for chest pain and palpitations.  Neurological:       Negative for lightheadedness.  Endo/Heme/Allergies:        Negative for hot/cold intolerance.   PHYSICAL EXAM: Blood pressure 127/77, pulse 91, temperature 97.9 F (36.6 C), height 5\' 4"  (1.626 m), weight 237 lb (107.5 kg), SpO2 91 %. Body mass index is 40.68 kg/m. Physical Exam Vitals signs reviewed.  Constitutional:      Appearance: Normal appearance. She is obese.  Cardiovascular:     Rate and Rhythm: Normal rate.     Pulses: Normal pulses.  Pulmonary:     Effort: Pulmonary effort is normal.     Breath sounds: Normal breath sounds.  Musculoskeletal: Normal range of motion.  Skin:    General: Skin is warm and dry.  Neurological:     Mental Status: She is alert and oriented to person, place, and time.  Psychiatric:        Behavior: Behavior normal.   RECENT LABS AND TESTS: BMET    Component Value Date/Time   NA 141 07/15/2019 0933   K 4.1 07/15/2019 0933   CL 103 07/15/2019 0933   CO2 28 07/15/2019 0933   GLUCOSE 109 (H) 07/15/2019 0933   BUN 11 07/15/2019 0933   CREATININE 0.82 07/15/2019 0933   CALCIUM 9.7 07/15/2019 0933   GFRNONAA >60 07/15/2019 0933   GFRAA >60 07/15/2019 0933   No results found for: HGBA1C No results found for: INSULIN CBC    Component Value Date/Time   WBC 14.0 (H) 07/15/2019 0933   RBC 5.01 07/15/2019 0933   HGB 13.1 07/15/2019 0933   HCT 41.8 07/15/2019 0933   PLT 284 07/15/2019 0933   MCV 83.4 07/15/2019 0933   MCH 26.1 07/15/2019 0933   MCHC 31.3 07/15/2019 0933   RDW 14.1 07/15/2019 0933   LYMPHSABS 5.3 (H) 07/15/2019 0933   MONOABS 1.0 07/15/2019 0933   EOSABS 0.6 (H) 07/15/2019 0933   BASOSABS 0.1 07/15/2019 0933   Iron/TIBC/Ferritin/ %Sat No results found for: IRON, TIBC, FERRITIN, IRONPCTSAT Lipid Panel  No results found for: CHOL, TRIG, HDL, CHOLHDL, VLDL, LDLCALC, LDLDIRECT Hepatic Function Panel     Component Value Date/Time   PROT 7.1 07/15/2019 0933   ALBUMIN 4.2 07/15/2019 0933   AST 16 07/15/2019 0933   ALT 11 07/15/2019 0933   ALKPHOS 81 07/15/2019 0933    BILITOT 0.4 07/15/2019 0933      Component Value Date/Time   TSH 2.890 09/17/2019 1053   No results found for: Vitamin D, 25-Hydroxy  OBESITY BEHAVIORAL INTERVENTION VISIT  Today's visit was #2  Starting  weight: 239 lbs Starting date: 09/17/2019 Today's weight: 237 lbs  Today's date: 10/01/2019 Total lbs lost to date: 2  At least 15 minutes were spent on discussing the following behavioral intervention visit.    10/01/2019  Height 5\' 4"  (1.626 m)  Weight 237 lb (107.5 kg)  BMI (Calculated) 40.66  BLOOD PRESSURE - SYSTOLIC [id number]  BLOOD PRESSURE - DIASTOLIC 77   Body Fat % [id number] %  Total Body Water (lbs) 78.8 lbs   ASK: We discussed the diagnosis of obesity with Cassandra Palmer today and Cassandra Palmer agreed to give [place] permission to discuss obesity behavioral modification therapy today.  ASSESS: Cassandra Palmer has the diagnosis of obesity and her BMI today is 40.0. Cassandra Palmer is in the action stage of change.   ADVISE: Cassandra Palmer was educated on the multiple health risks of obesity as well as the benefit of weight loss to improve her health. She was advised of the need for long term treatment and the importance of lifestyle modifications to improve her current health and to decrease her risk of future health problems.  AGREE: Multiple dietary modification options and treatment options were discussed and  Cassandra Palmer agreed to follow the recommendations documented in the above note.  ARRANGE: Cassandra Palmer was educated on the importance of frequent visits to treat obesity as outlined per CMS and USPSTF guidelines and agreed to schedule her next follow up appointment today.  Migdalia Dk, am acting as Location manager for [employer], DO  I have reviewed the above documentation for accuracy and completeness, and I agree with the above. -Cassandra Lesch, DO

## 2019-10-08 DIAGNOSIS — T8529XA Other mechanical complication of intraocular lens, initial encounter: Secondary | ICD-10-CM | POA: Diagnosis not present

## 2019-10-14 ENCOUNTER — Other Ambulatory Visit: Payer: Self-pay

## 2019-10-14 ENCOUNTER — Encounter (INDEPENDENT_AMBULATORY_CARE_PROVIDER_SITE_OTHER): Payer: Self-pay | Admitting: Bariatrics

## 2019-10-14 ENCOUNTER — Ambulatory Visit (INDEPENDENT_AMBULATORY_CARE_PROVIDER_SITE_OTHER): Payer: Medicare HMO | Admitting: Bariatrics

## 2019-10-14 VITALS — BP 128/71 | HR 80 | Temp 97.6°F | Ht 64.0 in | Wt 231.0 lb

## 2019-10-14 DIAGNOSIS — E119 Type 2 diabetes mellitus without complications: Secondary | ICD-10-CM | POA: Diagnosis not present

## 2019-10-14 DIAGNOSIS — Z6839 Body mass index (BMI) 39.0-39.9, adult: Secondary | ICD-10-CM

## 2019-10-14 DIAGNOSIS — I10 Essential (primary) hypertension: Secondary | ICD-10-CM | POA: Diagnosis not present

## 2019-10-15 ENCOUNTER — Ambulatory Visit (INDEPENDENT_AMBULATORY_CARE_PROVIDER_SITE_OTHER): Payer: Medicare HMO | Admitting: Bariatrics

## 2019-10-15 DIAGNOSIS — D649 Anemia, unspecified: Secondary | ICD-10-CM | POA: Diagnosis not present

## 2019-10-15 DIAGNOSIS — I10 Essential (primary) hypertension: Secondary | ICD-10-CM | POA: Diagnosis not present

## 2019-10-15 DIAGNOSIS — E039 Hypothyroidism, unspecified: Secondary | ICD-10-CM | POA: Diagnosis not present

## 2019-10-15 DIAGNOSIS — E1169 Type 2 diabetes mellitus with other specified complication: Secondary | ICD-10-CM | POA: Diagnosis not present

## 2019-10-15 DIAGNOSIS — E782 Mixed hyperlipidemia: Secondary | ICD-10-CM | POA: Diagnosis not present

## 2019-10-15 NOTE — Progress Notes (Signed)
Office: [phone]  /  Fax: [phone]   HPI:  Chief Complaint: OBESITY Cassandra Palmer is here to discuss her progress with her obesity treatment plan. She is on the Category 2 plan and journaling 400-500 calories and 35 grams of protein at supper and states she is following her eating plan approximately 90% of the time. She states she is exercising 0 minutes 0 times per week.  Cassandra Palmer gas been very adherent to the plan and has adapted some of the recipes. She reports getting adequate water intake.  Today's visit was #3 Starting weight: 239 lbs Starting date: 09/17/2019 Today's weight: 231 lbs  Today's date: 10/14/2019 Total lbs lost to date: 8 Total lbs lost since last in-office visit: 6  Type II Diabetes Mellitus Cassandra Palmer has type II diabetes mellitus and is taking Victoza, metformin, and [place]. She states fasting blood sugars range between 85 and 110 and reports 1 low of 65.   Hypertension Cassandra Palmer has a diagnosis of hypertension, which is well controlled. No lightheadedness. Blood pressure today 128/71.   ASSESSMENT AND PLAN:  Type 2 diabetes mellitus without complication, without long-term current use of insulin (HCC)  Essential hypertension  Class 2 severe obesity with serious comorbidity and body mass index (BMI) of 39.0 to 39.9 in adult, unspecified obesity type ([place])  PLAN:  Diabetes II Cassandra Palmer has been given diabetes education by myself today. Good blood sugar control is important to decrease the likelihood of diabetic complications such as nephropathy, neuropathy, limb loss, blindness, coronary artery disease, and death. Intensive lifestyle modification including diet, exercise and weight loss were discussed as the first line treatment for diabetes. Cassandra Palmer will continue all of her medications as prescribed. She states she does have glucose pills.  Hypertension Cassandra Palmer is working on healthy weight loss and exercise to improve blood pressure control. She will continue  her medications as prescribed. We will watch for signs of hypotension as she continues her lifestyle modifications.  Obesity Cassandra Palmer is currently in the action stage of change. As such, her goal is to continue with weight loss efforts. She has agreed to follow the Category 2 plan and journal 400-500 calories and 35 grams of protein at supper. Cassandra Palmer will work on Cassandra Palmer. She was given substitutions for 2 ounces of meat. Cassandra Palmer has been instructed to start power walking in the future for weight loss and overall health benefits. We discussed the following Behavioral Modification Strategies today: increasing lean protein intake, decreasing simple carbohydrates, increasing vegetables, increase H20 intake, decrease eating out, no skipping meals, work on meal planning and easy cooking plans, keeping healthy foods in the home, and planning for success.  Cassandra Palmer has agreed to follow-up with our clinic in 4 weeks. She was informed of the importance of frequent follow-up visits to maximize her success with intensive lifestyle modifications for her multiple health conditions.  ALLERGIES: Allergies  Allergen Reactions  . Invokana [Canagliflozin] Other (See Comments)    Yeast infections  . Actos [Pioglitazone] Swelling  . Tinactin Foot-Sneaker Deodor [employer]]     MEDICATIONS: Current Outpatient Medications on File Prior to Visit  Medication Sig Dispense Refill  . aspirin 325 MG tablet Take 325 mg by mouth daily.    . celecoxib (CELEBREX) 200 MG capsule Take 200 mg by mouth daily.    . cetirizine (ZYRTEC) 10 MG tablet Take 10 mg by mouth daily.    . Cholecalciferol (VITAMIN D3) 1.25 MG (50000 UT) CAPS Take by mouth.    . cyclobenzaprine (FLEXERIL) 10 MG  tablet Take 10 mg by mouth daily as needed for muscle spasms.    Marland Kitchen ezetimibe-simvastatin (VYTORIN) 10-40 MG tablet Take 1 tablet by mouth daily.    . furosemide (LASIX) 40 MG tablet Take 40 mg by mouth.    Marland Kitchen glucose blood test  strip 1 each by Other route as needed for other. Use as instructed    . insulin degludec (TRESIBA FLEXTOUCH) 100 UNIT/ML SOPN FlexTouch Pen Inject 30-40 Units into the skin daily.    Marland Kitchen levothyroxine (SYNTHROID) 50 MCG tablet Take 50 mcg by mouth daily before breakfast.    . liraglutide (VICTOZA) 18 MG/3ML SOPN Inject 1.8 mg into the skin daily.    . metFORMIN (GLUCOPHAGE) 1000 MG tablet Take 1,000 mg by mouth 2 (two) times daily with a meal.    . metroNIDAZOLE (METROCREAM) 0.75 % cream Apply 1 application topically as directed.    . montelukast (SINGULAIR) 10 MG tablet Take 10 mg by mouth at bedtime.    . niacin (NIASPAN) 1000 MG CR tablet Take 2,000 mg by mouth at bedtime.    . traZODone (DESYREL) 100 MG tablet Take 100 mg by mouth at bedtime as needed for sleep.    . carvedilol (COREG) 3.125 MG tablet Take 1 tablet (3.125 mg total) by mouth 2 (two) times daily. 180 tablet 3   No current facility-administered medications on file prior to visit.    PAST MEDICAL HISTORY: Past Medical History:  Diagnosis Date  . Anemia   . Back pain   . CLL (chronic lymphocytic leukemia) ([place])   . Diabetes mellitus type 2, uncontrolled ([place])   . Edema of both lower extremities   . Hyperlipidemia   . Hypertension   . Hypothyroidism   . Joint pain   . Lumbar spondylolysis   . Obesity   . Sciatic nerve disease   . SOB (shortness of breath)   . Vitamin D deficiency     PAST SURGICAL HISTORY: Past Surgical History:  Procedure Laterality Date  . ELBOW SURGERY    . KNEE SURGERY    . TONSILLECTOMY AND ADENOIDECTOMY      SOCIAL HISTORY: Social History   Tobacco Use  . Smoking status: Former Research scientist (life sciences)  . Smokeless tobacco: Never Used  Substance Use Topics  . Alcohol use: Yes    Alcohol/week: 1.0 - 2.0 standard drinks    Types: 1 - 2 Glasses of wine per week    Comment: Occasional  . Drug use: No    FAMILY HISTORY: Family History  Problem Relation Age of Onset  . Congestive Heart Failure  Mother   . Hypertension Mother   . Thyroid disease Mother   . Cancer Mother   . Anxiety disorder Mother   . Diabetes Father   . Heart Problems Father   . Hyperlipidemia Father   . Dementia Father   . Thyroid disease Father   . Cancer Father   . Congestive Heart Failure Maternal Grandmother   . Heart attack Maternal Grandfather   . Heart disease Paternal Grandmother    ROS: Review of Systems  Constitutional: Positive for weight loss.  Neurological:       Negative for lightheadedness.   PHYSICAL EXAM: Blood pressure 128/71, pulse 80, temperature 97.6 F (36.4 C), height 5\' 4"  (1.626 m), weight 231 lb (104.8 kg), SpO2 97 %. Body mass index is 39.65 kg/m. Physical Exam Vitals reviewed.  Constitutional:      Appearance: Normal appearance. She is obese.  Cardiovascular:  Rate and Rhythm: Normal rate.     Pulses: Normal pulses.  Pulmonary:     Effort: Pulmonary effort is normal.     Breath sounds: Normal breath sounds.  Musculoskeletal:        General: Normal range of motion.  Skin:    General: Skin is warm and dry.  Neurological:     Mental Status: She is alert and oriented to person, place, and time.  Psychiatric:        Behavior: Behavior normal.   RECENT LABS AND TESTS: BMET    Component Value Date/Time   NA 141 07/15/2019 0933   K 4.1 07/15/2019 0933   CL 103 07/15/2019 0933   CO2 28 07/15/2019 0933   GLUCOSE 109 (H) 07/15/2019 0933   BUN 11 07/15/2019 0933   CREATININE 0.82 07/15/2019 0933   CALCIUM 9.7 07/15/2019 0933   GFRNONAA >60 07/15/2019 0933   GFRAA >60 07/15/2019 0933   No results found for: HGBA1C No results found for: INSULIN CBC    Component Value Date/Time   WBC 14.0 (H) 07/15/2019 0933   RBC 5.01 07/15/2019 0933   HGB 13.1 07/15/2019 0933   HCT 41.8 07/15/2019 0933   PLT 284 07/15/2019 0933   MCV 83.4 07/15/2019 0933   MCH 26.1 07/15/2019 0933   MCHC 31.3 07/15/2019 0933   RDW 14.1 07/15/2019 0933   LYMPHSABS 5.3 (H) 07/15/2019  0933   MONOABS 1.0 07/15/2019 0933   EOSABS 0.6 (H) 07/15/2019 0933   BASOSABS 0.1 07/15/2019 0933   Iron/TIBC/Ferritin/ %Sat No results found for: IRON, TIBC, FERRITIN, IRONPCTSAT Lipid Panel  No results found for: CHOL, TRIG, HDL, CHOLHDL, VLDL, LDLCALC, LDLDIRECT Hepatic Function Panel     Component Value Date/Time   PROT 7.1 07/15/2019 0933   ALBUMIN 4.2 07/15/2019 0933   AST 16 07/15/2019 0933   ALT 11 07/15/2019 0933   ALKPHOS 81 07/15/2019 0933   BILITOT 0.4 07/15/2019 0933      Component Value Date/Time   TSH 2.890 09/17/2019 1053    OBESITY BEHAVIORAL INTERVENTION VISIT DOCUMENTATION FOR INSURANCE (~15 minutes)  ASK: We discussed the diagnosis of obesity with Elmore Guise today and Aaiza agreed to give [place] permission to discuss obesity behavioral modification therapy today.  ASSESS: Benna has the diagnosis of obesity and her BMI today is 39.7. Tarini is in the action stage of change.   ADVISE: Anjelita was educated on the multiple health risks of obesity as well as the benefit of weight loss to improve her health. She was advised of the need for long term treatment and the importance of lifestyle modifications to improve her current health and to decrease her risk of future health problems.  AGREE: Multiple dietary modification options and treatment options were discussed and  Vilda agreed to follow the recommendations documented in the above note.  ARRANGE: Fate was educated on the importance of frequent visits to treat obesity as outlined per CMS and USPSTF guidelines and agreed to schedule her next follow up appointment today.  Migdalia Dk, am acting as Location manager for [employer], DO  I have reviewed the above documentation for accuracy and completeness, and I agree with the above. -Jearld Lesch, DO

## 2019-10-16 ENCOUNTER — Encounter (INDEPENDENT_AMBULATORY_CARE_PROVIDER_SITE_OTHER): Payer: Self-pay | Admitting: Bariatrics

## 2019-11-12 ENCOUNTER — Ambulatory Visit (INDEPENDENT_AMBULATORY_CARE_PROVIDER_SITE_OTHER): Payer: Medicare HMO | Admitting: Bariatrics

## 2019-11-12 ENCOUNTER — Encounter (INDEPENDENT_AMBULATORY_CARE_PROVIDER_SITE_OTHER): Payer: Self-pay | Admitting: Bariatrics

## 2019-11-12 ENCOUNTER — Other Ambulatory Visit: Payer: Self-pay

## 2019-11-12 VITALS — BP 135/75 | HR 71 | Temp 97.5°F | Ht 64.0 in | Wt 229.0 lb

## 2019-11-12 DIAGNOSIS — I10 Essential (primary) hypertension: Secondary | ICD-10-CM | POA: Diagnosis not present

## 2019-11-12 DIAGNOSIS — E119 Type 2 diabetes mellitus without complications: Secondary | ICD-10-CM

## 2019-11-12 DIAGNOSIS — Z6839 Body mass index (BMI) 39.0-39.9, adult: Secondary | ICD-10-CM

## 2019-11-13 NOTE — Progress Notes (Signed)
Chief Complaint:   Cassandra Palmer is here to discuss her progress with her obesity treatment plan along with follow-up of her obesity related diagnoses. Cassandra Palmer is on the Category 2 Plan and states she is following her eating plan approximately 60% of the time. Cassandra Palmer states she is exercising 0 minutes 0 times per week.  Today's visit was #: 4 Starting weight: 239 lbs Starting date: 09/17/2019 Today's weight: 229 lbs Today's date: 11/12/2019 Total lbs lost to date: 10  Total lbs lost since last in-office visit: 2  Interim History: Cassandra Palmer is down 2 lbs and doing well overall. She is craving some "munchie foods."  Subjective:   Essential hypertension. This is controlled. She is taking Coreg.  BP Readings from Last 3 Encounters:  11/12/19 135/75  10/14/19 128/71  10/01/19 127/77   Type 2 diabetes mellitus without complication, without long-term current use of insulin ([place]). Cassandra Palmer is taking Victoza and metformin. Fasting blood sugars range between 70 and 115.  No results found for: HGBA1C Lab Results  Component Value Date   CREATININE 0.82 07/15/2019   Assessment/Plan:   Essential hypertension. Cassandra Palmer is working on healthy weight loss and exercise to improve blood pressure control. We will watch for signs of hypotension as she continues her lifestyle modifications. She will continue her medications as prescribed.  Type 2 diabetes mellitus without complication, without long-term current use of insulin (Cassandra Palmer). Cassandra Palmer has been given diabetes education by myself today. Good blood sugar control is important to decrease the likelihood of diabetic complications such as nephropathy, neuropathy, limb loss, blindness, coronary artery disease, and death. Intensive lifestyle modification including diet, exercise and weight loss were discussed as the first line treatment for diabetes. She will continue her medications. She will decrease carbohydrates and increase healthy  fats and protein.  Class 2 severe obesity with serious comorbidity and body mass index (BMI) of 39.0 to 39.9 in adult, unspecified obesity type (Cassandra Palmer).  Cassandra Palmer is currently in the action stage of change. As such, her goal is to continue with weight loss efforts. She has agreed to on the Category 2 Plan and journal 400-500 calories and 35+ grams of protein at supper.  She will work on meal planning, intentional eating, and will increase nuts.  We discussed the following exercise goals today: Cassandra Palmer will get back to swimming next month.  We discussed the following behavioral modification strategies today: increasing lean protein intake, decreasing simple carbohydrates, increasing vegetables, increasing water intake, decreasing eating out, no skipping meals, meal planning and cooking strategies, keeping healthy foods in the home and planning for success.  Cassandra Palmer has agreed to follow-up with our clinic in 4 weeks. She was informed of the importance of frequent follow-up visits to maximize her success with intensive lifestyle modifications for her multiple health conditions.   Objective:   Blood pressure 135/75, pulse 71, temperature (!) 97.5 F (36.4 C), height 5\' 4"  (1.626 m), weight 229 lb (103.9 kg), SpO2 96 %. Body mass index is 39.31 kg/m.  General: Cooperative, alert, well developed, in no acute distress. HEENT: Conjunctivae and lids unremarkable. Neck: No thyromegaly.  Cardiovascular: Regular rhythm.  Lungs: Normal work of breathing. Extremities: No edema.  Neurologic: No focal deficits.   Lab Results  Component Value Date   CREATININE 0.82 07/15/2019   BUN 11 07/15/2019   NA 141 07/15/2019   K 4.1 07/15/2019   CL 103 07/15/2019   CO2 28 07/15/2019   Lab Results  Component Value Date  ALT 11 07/15/2019   AST 16 07/15/2019   ALKPHOS 81 07/15/2019   BILITOT 0.4 07/15/2019   No results found for: HGBA1C No results found for: INSULIN Lab Results    Component Value Date   TSH 2.890 09/17/2019   No results found for: CHOL, HDL, LDLCALC, LDLDIRECT, TRIG, CHOLHDL Lab Results  Component Value Date   WBC 14.0 (H) 07/15/2019   HGB 13.1 07/15/2019   HCT 41.8 07/15/2019   MCV 83.4 07/15/2019   PLT 284 07/15/2019   No results found for: IRON, TIBC, FERRITIN  Obesity Behavioral Intervention Documentation for Insurance:   Approximately 15 minutes were spent on the discussion below.  ASK: We discussed the diagnosis of obesity with Cassandra Palmer today and Cassandra Palmer agreed to give [place] permission to discuss obesity behavioral modification therapy today.  ASSESS: Cassandra Palmer has the diagnosis of obesity and her BMI today is 39.3. Cassandra Palmer is in the action stage of change.   ADVISE: Cassandra Palmer was educated on the multiple health risks of obesity as well as the benefit of weight loss to improve her health. She was advised of the need for long term treatment and the importance of lifestyle modifications to improve her current health and to decrease her risk of future health problems.  AGREE: Multiple dietary modification options and treatment options were discussed and Cassandra Palmer agreed to follow the recommendations documented in the above note.  ARRANGE: Cassandra Palmer was educated on the importance of frequent visits to treat obesity as outlined per CMS and USPSTF guidelines and agreed to schedule her next follow up appointment today.  Attestation Statements:   Reviewed by clinician on day of visit: allergies, medications, problem list, medical history, surgical history, family history, social history, and previous encounter notes.  Cassandra Palmer, am acting as Location manager for [employer], DO   Her total visit including pre and post review and preparation was 22 minutes.   I have reviewed the above documentation for accuracy and completeness, and I agree with the above. Cassandra Lesch, DO

## 2019-11-17 ENCOUNTER — Encounter (INDEPENDENT_AMBULATORY_CARE_PROVIDER_SITE_OTHER): Payer: Self-pay | Admitting: Bariatrics

## 2019-11-28 DIAGNOSIS — I10 Essential (primary) hypertension: Secondary | ICD-10-CM | POA: Diagnosis not present

## 2019-11-28 DIAGNOSIS — E039 Hypothyroidism, unspecified: Secondary | ICD-10-CM | POA: Diagnosis not present

## 2019-11-28 DIAGNOSIS — E782 Mixed hyperlipidemia: Secondary | ICD-10-CM | POA: Diagnosis not present

## 2019-11-28 DIAGNOSIS — E1169 Type 2 diabetes mellitus with other specified complication: Secondary | ICD-10-CM | POA: Diagnosis not present

## 2019-11-28 DIAGNOSIS — D649 Anemia, unspecified: Secondary | ICD-10-CM | POA: Diagnosis not present

## 2019-11-29 ENCOUNTER — Ambulatory Visit: Payer: Medicare HMO

## 2019-12-04 ENCOUNTER — Ambulatory Visit: Payer: Medicare HMO

## 2019-12-06 ENCOUNTER — Ambulatory Visit: Payer: Medicare HMO | Attending: Internal Medicine

## 2019-12-06 DIAGNOSIS — Z23 Encounter for immunization: Secondary | ICD-10-CM | POA: Insufficient documentation

## 2019-12-06 NOTE — Progress Notes (Signed)
   Covid-19 Vaccination Clinic  Name:  Cassandra Palmer    MRN: [id number] DOB: 10-Nov-1945  12/06/2019  Cassandra Palmer was observed post Covid-19 immunization for 15 minutes without incidence. She was provided with Vaccine Information Sheet and instruction to access the V-Safe system.   Cassandra Palmer was instructed to call 911 with any severe reactions post vaccine: Marland Kitchen Difficulty breathing  . Swelling of your face and throat  . A fast heartbeat  . A bad rash all over your body  . Dizziness and weakness    Immunizations Administered    Name Date Dose VIS Date Route   Pfizer COVID-19 Vaccine 12/06/2019  8:49 AM 0.3 mL 10/10/2019 Intramuscular   Manufacturer: [place]   Lot: [id number]   [place]: [id number]

## 2019-12-09 ENCOUNTER — Encounter (INDEPENDENT_AMBULATORY_CARE_PROVIDER_SITE_OTHER): Payer: Self-pay | Admitting: Bariatrics

## 2019-12-09 ENCOUNTER — Other Ambulatory Visit: Payer: Self-pay

## 2019-12-09 ENCOUNTER — Ambulatory Visit (INDEPENDENT_AMBULATORY_CARE_PROVIDER_SITE_OTHER): Payer: Medicare HMO | Admitting: Bariatrics

## 2019-12-09 VITALS — BP 114/74 | HR 82 | Temp 97.7°F | Ht 64.0 in | Wt 226.0 lb

## 2019-12-09 DIAGNOSIS — I10 Essential (primary) hypertension: Secondary | ICD-10-CM

## 2019-12-09 DIAGNOSIS — Z6838 Body mass index (BMI) 38.0-38.9, adult: Secondary | ICD-10-CM | POA: Diagnosis not present

## 2019-12-09 DIAGNOSIS — E119 Type 2 diabetes mellitus without complications: Secondary | ICD-10-CM

## 2019-12-09 NOTE — Progress Notes (Signed)
Chief Complaint:   Cassandra Palmer is here to discuss her progress with her obesity treatment plan along with follow-up of her obesity related diagnoses. Cassandra Palmer is on the Category 2 Plan and states she is following her eating plan approximately 60-70% of the time. Cassandra Palmer states she is walking 15-20 minutes 3 times per week.  Today's visit was #: 5 Starting weight: 239 lbs Starting date: 09/17/2019 Today's weight: 226 lbs Today's date: 12/09/2019 Total lbs lost to date: 13 Total lbs lost since last in-office visit: 3  Interim History: Cassandra Palmer is down 3 lbs and has done well overall. She does well with water, but struggles with getting in all of her protein.  Subjective:   Essential hypertension. Cassandra Palmer is taking Coreg. Blood pressure is well controlled.  BP Readings from Last 3 Encounters:  12/09/19 114/74  11/12/19 135/75  10/14/19 128/71   Lab Results  Component Value Date   CREATININE 0.82 07/15/2019   CREATININE 0.80 01/10/2019   CREATININE 0.99 08/05/2018   Type 2 diabetes mellitus without complication, without long-term current use of insulin ([place]). Cassandra Palmer is taking [place], Victoza, and Glucophage. She states fasting blood sugars range between 65 and 105.  No results found for: HGBA1C Lab Results  Component Value Date   CREATININE 0.82 07/15/2019   No results found for: INSULIN  Assessment/Plan:   Essential hypertension. Cassandra Palmer is working on healthy weight loss and exercise to improve blood pressure control. We will watch for signs of hypotension as she continues her lifestyle modifications. She will continue her medications as prescribed.  Type 2 diabetes mellitus without complication, without long-term current use of insulin ([place]). Good blood sugar control is important to decrease the likelihood of diabetic complications such as nephropathy, neuropathy, limb loss, blindness, coronary artery disease, and death. Intensive lifestyle modification  including diet, exercise and weight loss are the first line of treatment for diabetes. Cassandra Palmer will continue her medications as prescribed and will continue to check FBS and 2-hour postprandials.  Class 2 severe obesity with serious comorbidity and body mass index (BMI) of 38.0 to 38.9 in adult, unspecified obesity type ([place]).  Cassandra Palmer is currently in the action stage of change. As such, her goal is to continue with weight loss efforts. She has agreed to the Category 2 Plan and journal 400-500 calories and 35+ grams of protein at supper.   She will work on meal planning and intentional eating.  Exercise goals: Cassandra Palmer will continue walking 15-20 minutes 3 times per week.  Behavioral modification strategies: increasing lean protein intake, decreasing simple carbohydrates, increasing vegetables, increasing water intake, decreasing eating out, no skipping meals, meal planning and cooking strategies, keeping healthy foods in the home and planning for success.  Cassandra Palmer has agreed to follow-up with our clinic in 2 weeks. She was informed of the importance of frequent follow-up visits to maximize her success with intensive lifestyle modifications for her multiple health conditions.   Objective:   Blood pressure 114/74, pulse 82, temperature 97.7 F (36.5 C), height 5\' 4"  (1.626 m), weight 226 lb (102.5 kg), SpO2 96 %. Body mass index is 38.79 kg/m.  General: Cooperative, alert, well developed, in no acute distress. HEENT: Conjunctivae and lids unremarkable. Cardiovascular: Regular rhythm.  Lungs: Normal work of breathing. Neurologic: No focal deficits.   Lab Results  Component Value Date   CREATININE 0.82 07/15/2019   BUN 11 07/15/2019   NA 141 07/15/2019   K 4.1 07/15/2019   CL 103 07/15/2019  CO2 28 07/15/2019   Lab Results  Component Value Date   ALT 11 07/15/2019   AST 16 07/15/2019   ALKPHOS 81 07/15/2019   BILITOT 0.4 07/15/2019   No results found for: HGBA1C No results  found for: INSULIN Lab Results  Component Value Date   TSH 2.890 09/17/2019   No results found for: CHOL, HDL, LDLCALC, LDLDIRECT, TRIG, CHOLHDL Lab Results  Component Value Date   WBC 14.0 (H) 07/15/2019   HGB 13.1 07/15/2019   HCT 41.8 07/15/2019   MCV 83.4 07/15/2019   PLT 284 07/15/2019   No results found for: IRON, TIBC, FERRITIN  Obesity Behavioral Intervention Documentation for Insurance:   Approximately 15 minutes were spent on the discussion below.  ASK: We discussed the diagnosis of obesity with Cassandra Palmer today and Cassandra Palmer agreed to give [place] permission to discuss obesity behavioral modification therapy today.  ASSESS: Cassandra Palmer has the diagnosis of obesity and her BMI today is 38.8. Cassandra Palmer is in the action stage of change.   ADVISE: Cassandra Palmer was educated on the multiple health risks of obesity as well as the benefit of weight loss to improve her health. She was advised of the need for long term treatment and the importance of lifestyle modifications to improve her current health and to decrease her risk of future health problems.  AGREE: Multiple dietary modification options and treatment options were discussed and Cassandra Palmer agreed to follow the recommendations documented in the above note.  ARRANGE: Cassandra Palmer was educated on the importance of frequent visits to treat obesity as outlined per CMS and USPSTF guidelines and agreed to schedule her next follow up appointment today.  Attestation Statements:   Reviewed by clinician on day of visit: allergies, medications, problem list, medical history, surgical history, family history, social history, and previous encounter notes.  Time spent on visit including pre-visit chart review and post-visit care was 20 minutes.   Migdalia Dk, am acting as Location manager for [employer], DO   I have reviewed the above documentation for accuracy and completeness, and I agree with the above. Jearld Lesch, DO

## 2019-12-10 ENCOUNTER — Encounter (INDEPENDENT_AMBULATORY_CARE_PROVIDER_SITE_OTHER): Payer: Self-pay | Admitting: Bariatrics

## 2019-12-31 ENCOUNTER — Ambulatory Visit: Payer: Medicare HMO | Attending: Internal Medicine

## 2019-12-31 DIAGNOSIS — Z23 Encounter for immunization: Secondary | ICD-10-CM

## 2019-12-31 NOTE — Progress Notes (Signed)
   Covid-19 Vaccination Clinic  Name:  Cassandra Palmer    MRN: [id number] DOB: [birth year]  12/31/2019  [name] was observed post Covid-19 immunization for 15 minutes without incident. She was provided with Vaccine Information Sheet and instruction to access the V-Safe system.   [name] was instructed to call 911 with any severe reactions post vaccine: Marland Kitchen Difficulty breathing  . Swelling of face and throat  . A fast heartbeat  . A bad rash all over body  . Dizziness and weakness   Immunizations Administered    Name Date Dose VIS Date Route   Pfizer COVID-19 Vaccine 12/31/2019  9:39 AM 0.3 mL 10/10/2019 Intramuscular   Manufacturer: [place]   Lot: [id number]   [place]: [id number]

## 2020-01-05 ENCOUNTER — Encounter (INDEPENDENT_AMBULATORY_CARE_PROVIDER_SITE_OTHER): Payer: Self-pay | Admitting: Bariatrics

## 2020-01-05 DIAGNOSIS — E782 Mixed hyperlipidemia: Secondary | ICD-10-CM | POA: Insufficient documentation

## 2020-01-05 DIAGNOSIS — E559 Vitamin D deficiency, unspecified: Secondary | ICD-10-CM | POA: Insufficient documentation

## 2020-01-05 DIAGNOSIS — I10 Essential (primary) hypertension: Secondary | ICD-10-CM | POA: Insufficient documentation

## 2020-01-06 ENCOUNTER — Encounter (INDEPENDENT_AMBULATORY_CARE_PROVIDER_SITE_OTHER): Payer: Self-pay | Admitting: Bariatrics

## 2020-01-06 ENCOUNTER — Other Ambulatory Visit: Payer: Self-pay

## 2020-01-06 ENCOUNTER — Ambulatory Visit (INDEPENDENT_AMBULATORY_CARE_PROVIDER_SITE_OTHER): Payer: Medicare HMO | Admitting: Bariatrics

## 2020-01-06 VITALS — BP 131/84 | HR 92 | Temp 97.6°F | Ht 64.0 in | Wt 224.0 lb

## 2020-01-06 DIAGNOSIS — E1169 Type 2 diabetes mellitus with other specified complication: Secondary | ICD-10-CM

## 2020-01-06 DIAGNOSIS — Z6838 Body mass index (BMI) 38.0-38.9, adult: Secondary | ICD-10-CM | POA: Diagnosis not present

## 2020-01-06 DIAGNOSIS — I10 Essential (primary) hypertension: Secondary | ICD-10-CM

## 2020-01-06 DIAGNOSIS — E669 Obesity, unspecified: Secondary | ICD-10-CM | POA: Diagnosis not present

## 2020-01-06 NOTE — Progress Notes (Signed)
Chief Complaint:   Cassandra Palmer is here to discuss her progress with her obesity treatment plan along with follow-up of her obesity related diagnoses. Cassandra Palmer is on the Category 2 Plan and states she is following her eating plan approximately 75% of the time. Cassandra Palmer states she is walking 25 minutes 3 times per week.  Today's visit was #: 6 Starting weight: 239 lbs Starting date: 09/17/2019 Today's weight: 224 lbs Today's date: 01/06/2020 Total lbs lost to date: 15 Total lbs lost since last in-office visit: 2  Interim History: Cassandra Palmer is down 2 lbs and doing well overall. She reports doing well with her water intake.  Subjective:   Essential hypertension. Cassandra Palmer is taking Coreg.  BP Readings from Last 3 Encounters:  01/06/20 131/84  12/09/19 114/74  11/12/19 135/75   Lab Results  Component Value Date   CREATININE 0.82 07/15/2019   CREATININE 0.80 01/10/2019   CREATININE 0.99 08/05/2018   Diabetes mellitus type 2 in obese ([place]). Fasting blood sugars range between 70's and 110's with 1 low at 68. Cassandra Palmer is taking [place], Victoza, and Glucophage.  No results found for: HGBA1C Lab Results  Component Value Date   CREATININE 0.82 07/15/2019   No results found for: INSULIN  Assessment/Plan:   Essential hypertension. Cassandra Palmer is working on healthy weight loss and exercise to improve blood pressure control. We will watch for signs of hypotension as she continues her lifestyle modifications. She will continue medications as directed.  Diabetes mellitus type 2 in obese ([place]). Good blood sugar control is important to decrease the likelihood of diabetic complications such as nephropathy, neuropathy, limb loss, blindness, coronary artery disease, and death. Intensive lifestyle modification including diet, exercise and weight loss are the first line of treatment for diabetes. Cassandra Palmer's PCP will do labs Cassandra Palmer).  Class 2 severe obesity with serious comorbidity and  body mass index (BMI) of 38.0 to 38.9 in adult, unspecified obesity type ([place]).  Cassandra Palmer is currently in the action stage of change. As such, her goal is to continue with weight loss efforts. She has agreed to the Category 2 Plan.   She will work on meal planning, intentional eating, increasing protein, and decreasing fruit.  Exercise goals: Cassandra Palmer will walk for 25 minutes 3 times per week and do yard work. She will start swimming.  Behavioral modification strategies: increasing lean protein intake, decreasing simple carbohydrates, increasing vegetables, increasing water intake, decreasing eating out, no skipping meals, meal planning and cooking strategies and keeping healthy foods in the home.  Cassandra Palmer has agreed to follow-up with our clinic in 4 weeks. She was informed of the importance of frequent follow-up visits to maximize her success with intensive lifestyle modifications for her multiple health conditions.   Objective:   Blood pressure 131/84, pulse 92, temperature 97.6 F (36.4 C), height 5\' 4"  (1.626 m), weight 224 lb (101.6 kg), SpO2 97 %. Body mass index is 38.45 kg/m.  General: Cooperative, alert, well developed, in no acute distress. HEENT: Conjunctivae and lids unremarkable. Cardiovascular: Regular rhythm.  Lungs: Normal work of breathing. Neurologic: No focal deficits.   Lab Results  Component Value Date   CREATININE 0.82 07/15/2019   BUN 11 07/15/2019   NA 141 07/15/2019   K 4.1 07/15/2019   CL 103 07/15/2019   CO2 28 07/15/2019   Lab Results  Component Value Date   ALT 11 07/15/2019   AST 16 07/15/2019   ALKPHOS 81 07/15/2019   BILITOT 0.4 07/15/2019   No  results found for: HGBA1C No results found for: INSULIN Lab Results  Component Value Date   TSH 2.890 09/17/2019   No results found for: CHOL, HDL, LDLCALC, LDLDIRECT, TRIG, CHOLHDL Lab Results  Component Value Date   WBC 14.0 (H) 07/15/2019   HGB 13.1 07/15/2019   HCT 41.8 07/15/2019   MCV  83.4 07/15/2019   PLT 284 07/15/2019   No results found for: IRON, TIBC, FERRITIN  Obesity Behavioral Intervention Documentation for Insurance:   Approximately 15 minutes were spent on the discussion below.  ASK: We discussed the diagnosis of obesity with Cassandra Palmer today and Cassandra Palmer agreed to give [place] permission to discuss obesity behavioral modification therapy today.  ASSESS: Cassandra Palmer has the diagnosis of obesity and her BMI today is 38.5. Cassandra Palmer is in the action stage of change.   ADVISE: Cassandra Palmer was educated on the multiple health risks of obesity as well as the benefit of weight loss to improve her health. She was advised of the need for long term treatment and the importance of lifestyle modifications to improve her current health and to decrease her risk of future health problems.  AGREE: Multiple dietary modification options and treatment options were discussed and Cassandra Palmer agreed to follow the recommendations documented in the above note.  ARRANGE: Cassandra Palmer was educated on the importance of frequent visits to treat obesity as outlined per CMS and USPSTF guidelines and agreed to schedule her next follow up appointment today.  Attestation Statements:   Reviewed by clinician on day of visit: allergies, medications, problem list, medical history, surgical history, family history, social history, and previous encounter notes.  Cassandra Palmer, am acting as Location manager for [employer], DO   I have reviewed the above documentation for accuracy and completeness, and I agree with the above. Cassandra Lesch, DO

## 2020-01-16 DIAGNOSIS — E1169 Type 2 diabetes mellitus with other specified complication: Secondary | ICD-10-CM | POA: Diagnosis not present

## 2020-01-16 DIAGNOSIS — G4700 Insomnia, unspecified: Secondary | ICD-10-CM | POA: Diagnosis not present

## 2020-01-16 DIAGNOSIS — I10 Essential (primary) hypertension: Secondary | ICD-10-CM | POA: Diagnosis not present

## 2020-01-16 DIAGNOSIS — E782 Mixed hyperlipidemia: Secondary | ICD-10-CM | POA: Diagnosis not present

## 2020-01-16 DIAGNOSIS — E039 Hypothyroidism, unspecified: Secondary | ICD-10-CM | POA: Diagnosis not present

## 2020-01-16 DIAGNOSIS — D649 Anemia, unspecified: Secondary | ICD-10-CM | POA: Diagnosis not present

## 2020-01-22 DIAGNOSIS — Z0000 Encounter for general adult medical examination without abnormal findings: Secondary | ICD-10-CM | POA: Diagnosis not present

## 2020-01-22 DIAGNOSIS — E559 Vitamin D deficiency, unspecified: Secondary | ICD-10-CM | POA: Diagnosis not present

## 2020-01-22 DIAGNOSIS — E782 Mixed hyperlipidemia: Secondary | ICD-10-CM | POA: Diagnosis not present

## 2020-01-22 DIAGNOSIS — C9110 Chronic lymphocytic leukemia of B-cell type not having achieved remission: Secondary | ICD-10-CM | POA: Diagnosis not present

## 2020-01-22 DIAGNOSIS — I10 Essential (primary) hypertension: Secondary | ICD-10-CM | POA: Diagnosis not present

## 2020-01-22 DIAGNOSIS — M5430 Sciatica, unspecified side: Secondary | ICD-10-CM | POA: Diagnosis not present

## 2020-01-22 DIAGNOSIS — Z23 Encounter for immunization: Secondary | ICD-10-CM | POA: Diagnosis not present

## 2020-01-22 DIAGNOSIS — E1169 Type 2 diabetes mellitus with other specified complication: Secondary | ICD-10-CM | POA: Diagnosis not present

## 2020-01-22 DIAGNOSIS — E039 Hypothyroidism, unspecified: Secondary | ICD-10-CM | POA: Diagnosis not present

## 2020-01-27 DIAGNOSIS — E039 Hypothyroidism, unspecified: Secondary | ICD-10-CM | POA: Diagnosis not present

## 2020-01-27 DIAGNOSIS — E1169 Type 2 diabetes mellitus with other specified complication: Secondary | ICD-10-CM | POA: Diagnosis not present

## 2020-02-03 DIAGNOSIS — R69 Illness, unspecified: Secondary | ICD-10-CM | POA: Diagnosis not present

## 2020-02-05 ENCOUNTER — Ambulatory Visit (INDEPENDENT_AMBULATORY_CARE_PROVIDER_SITE_OTHER): Payer: Medicare HMO | Admitting: Bariatrics

## 2020-02-10 DIAGNOSIS — H04123 Dry eye syndrome of bilateral lacrimal glands: Secondary | ICD-10-CM | POA: Diagnosis not present

## 2020-02-10 DIAGNOSIS — Z961 Presence of intraocular lens: Secondary | ICD-10-CM | POA: Diagnosis not present

## 2020-02-10 DIAGNOSIS — R69 Illness, unspecified: Secondary | ICD-10-CM | POA: Diagnosis not present

## 2020-02-17 ENCOUNTER — Ambulatory Visit (INDEPENDENT_AMBULATORY_CARE_PROVIDER_SITE_OTHER): Payer: Medicare HMO | Admitting: Bariatrics

## 2020-02-17 ENCOUNTER — Encounter (INDEPENDENT_AMBULATORY_CARE_PROVIDER_SITE_OTHER): Payer: Self-pay | Admitting: Bariatrics

## 2020-02-17 ENCOUNTER — Other Ambulatory Visit: Payer: Self-pay

## 2020-02-17 VITALS — BP 142/81 | HR 96 | Temp 97.6°F | Ht 64.0 in | Wt 223.0 lb

## 2020-02-17 DIAGNOSIS — E782 Mixed hyperlipidemia: Secondary | ICD-10-CM

## 2020-02-17 DIAGNOSIS — Z6838 Body mass index (BMI) 38.0-38.9, adult: Secondary | ICD-10-CM

## 2020-02-17 DIAGNOSIS — E1169 Type 2 diabetes mellitus with other specified complication: Secondary | ICD-10-CM

## 2020-02-17 DIAGNOSIS — E669 Obesity, unspecified: Secondary | ICD-10-CM | POA: Diagnosis not present

## 2020-02-18 ENCOUNTER — Encounter (INDEPENDENT_AMBULATORY_CARE_PROVIDER_SITE_OTHER): Payer: Self-pay | Admitting: Bariatrics

## 2020-02-18 NOTE — Progress Notes (Signed)
Chief Complaint:   Cassandra Palmer is here to discuss her progress with her obesity treatment plan along with follow-up of her obesity related diagnoses. Cassandra Palmer is on the Category 2 Plan and states she is following her eating plan approximately 60% of the time. Cassandra Palmer states she is doing water aerobics 45 minutes 1 time per week.  Today's visit was #: 7 Starting weight: 239 lbs Starting date: 09/17/2019 Today's weight: 223 lbs Today's date: 02/17/2020 Total lbs lost to date: 16 Total lbs lost since last in-office visit: 1  Interim History: Cassandra Palmer is down 1 lb. She did not eat on plan during the Easter holiday.  Subjective:   Type 2 diabetes mellitus with obesity ([place]). Cassandra Palmer is on [place], Victoza, and metformin. Fasting blood sugars 70-110. Last A1c was reported to be 6.7.   No results found for: HGBA1C Lab Results  Component Value Date   CREATININE 0.82 07/15/2019   No results found for: INSULIN  Mixed hyperlipidemia. Cassandra Palmer is on Vytorin.  No results found for: CHOL, HDL, LDLCALC, LDLDIRECT, TRIG, CHOLHDL Lab Results  Component Value Date   ALT 11 07/15/2019   AST 16 07/15/2019   ALKPHOS 81 07/15/2019   BILITOT 0.4 07/15/2019   The ASCVD Risk score Cassandra Bussing DC Jr., et al., 2013) failed to calculate for the following reasons:   Cannot find a previous HDL lab   Cannot find a previous total cholesterol lab  Assessment/Plan:   Type 2 diabetes mellitus with obesity ([place]). Good blood sugar control is important to decrease the likelihood of diabetic complications such as nephropathy, neuropathy, limb loss, blindness, coronary artery disease, and death. Intensive lifestyle modification including diet, exercise and weight loss are the first line of treatment for diabetes. Cassandra Palmer will continue checking fasting blood sugars and 2-hour postprandials. If she has any lows, will decrease insulin slowly.  Mixed hyperlipidemia. Cardiovascular risk and specific  lipid/LDL goals reviewed.  We discussed several lifestyle modifications today and Cassandra Palmer will continue to work on diet, exercise and weight loss efforts. Orders and follow up as documented in patient record. She will continue her medication as directed.  Counseling Intensive lifestyle modifications are the first line treatment for this issue. . Dietary changes: Increase soluble fiber. Decrease simple carbohydrates. . Exercise changes: Moderate to vigorous-intensity aerobic activity 150 minutes per week if tolerated. . Lipid-lowering medications: see documented in medical record.  Class 2 severe obesity with serious comorbidity and body mass index (BMI) of 38.0 to 38.9 in adult, unspecified obesity type (Cassandra Palmer).  Cassandra Palmer is currently in the action stage of change. As such, her goal is to continue with weight loss efforts. She has agreed to the Category 2 Plan.   She will work on meal planning, continue exercising, and decrease carb intake.  Exercise goals: Older adults should follow the adult guidelines. When older adults cannot meet the adult guidelines, they should be as physically active as their abilities and conditions will allow.   Behavioral modification strategies: increasing lean protein intake, decreasing simple carbohydrates, increasing vegetables, increasing water intake, decreasing eating out, no skipping meals, meal planning and cooking strategies, keeping healthy foods in the home and planning for success.  Cassandra Palmer has agreed to follow-up with our clinic in 4 weeks. She was informed of the importance of frequent follow-up visits to maximize her success with intensive lifestyle modifications for her multiple health conditions.   Objective:   Blood pressure (!) 142/81, pulse 96, temperature 97.6 F (36.4 C), height 5\' 4"  (  1.626 m), weight 223 lb (101.2 kg), SpO2 93 %. Body mass index is 38.28 kg/m.  General: Cooperative, alert, well developed, in no acute distress. HEENT:  Conjunctivae and lids unremarkable. Cardiovascular: Regular rhythm.  Lungs: Normal work of breathing. Neurologic: No focal deficits.   Lab Results  Component Value Date   CREATININE 0.82 07/15/2019   BUN 11 07/15/2019   NA 141 07/15/2019   K 4.1 07/15/2019   CL 103 07/15/2019   CO2 28 07/15/2019   Lab Results  Component Value Date   ALT 11 07/15/2019   AST 16 07/15/2019   ALKPHOS 81 07/15/2019   BILITOT 0.4 07/15/2019   No results found for: HGBA1C No results found for: INSULIN Lab Results  Component Value Date   TSH 2.890 09/17/2019   No results found for: CHOL, HDL, LDLCALC, LDLDIRECT, TRIG, CHOLHDL Lab Results  Component Value Date   WBC 14.0 (H) 07/15/2019   HGB 13.1 07/15/2019   HCT 41.8 07/15/2019   MCV 83.4 07/15/2019   PLT 284 07/15/2019   No results found for: IRON, TIBC, FERRITIN  Obesity Behavioral Intervention Documentation for Insurance:   Approximately 15 minutes were spent on the discussion below.  ASK: We discussed the diagnosis of obesity with Cassandra Palmer today and Cassandra Palmer agreed to give [place] permission to discuss obesity behavioral modification therapy today.  ASSESS: Cassandra Palmer has the diagnosis of obesity and her BMI today is 38.4. Cassandra Palmer is in the action stage of change.   ADVISE: Cassandra Palmer was educated on the multiple health risks of obesity as well as the benefit of weight loss to improve her health. She was advised of the need for long term treatment and the importance of lifestyle modifications to improve her current health and to decrease her risk of future health problems.  AGREE: Multiple dietary modification options and treatment options were discussed and Cassandra Palmer agreed to follow the recommendations documented in the above note.  ARRANGE: Cassandra Palmer was educated on the importance of frequent visits to treat obesity as outlined per CMS and USPSTF guidelines and agreed to schedule her next follow up appointment today.  Attestation Statements:    Reviewed by clinician on day of visit: allergies, medications, problem list, medical history, surgical history, family history, social history, and previous encounter notes.  Migdalia Dk, am acting as Location manager for [employer], DO   I have reviewed the above documentation for accuracy and completeness, and I agree with the above. Jearld Lesch, DO

## 2020-03-08 DIAGNOSIS — D649 Anemia, unspecified: Secondary | ICD-10-CM | POA: Diagnosis not present

## 2020-03-08 DIAGNOSIS — E782 Mixed hyperlipidemia: Secondary | ICD-10-CM | POA: Diagnosis not present

## 2020-03-08 DIAGNOSIS — E039 Hypothyroidism, unspecified: Secondary | ICD-10-CM | POA: Diagnosis not present

## 2020-03-08 DIAGNOSIS — G4700 Insomnia, unspecified: Secondary | ICD-10-CM | POA: Diagnosis not present

## 2020-03-08 DIAGNOSIS — E1169 Type 2 diabetes mellitus with other specified complication: Secondary | ICD-10-CM | POA: Diagnosis not present

## 2020-03-08 DIAGNOSIS — I10 Essential (primary) hypertension: Secondary | ICD-10-CM | POA: Diagnosis not present

## 2020-03-30 ENCOUNTER — Other Ambulatory Visit: Payer: Self-pay

## 2020-03-30 ENCOUNTER — Ambulatory Visit (INDEPENDENT_AMBULATORY_CARE_PROVIDER_SITE_OTHER): Payer: Medicare HMO | Admitting: Bariatrics

## 2020-03-30 ENCOUNTER — Encounter (INDEPENDENT_AMBULATORY_CARE_PROVIDER_SITE_OTHER): Payer: Self-pay | Admitting: Bariatrics

## 2020-03-30 VITALS — BP 143/81 | HR 87 | Temp 97.9°F | Ht 64.0 in | Wt 225.0 lb

## 2020-03-30 DIAGNOSIS — Z6838 Body mass index (BMI) 38.0-38.9, adult: Secondary | ICD-10-CM

## 2020-03-30 DIAGNOSIS — F3289 Other specified depressive episodes: Secondary | ICD-10-CM | POA: Diagnosis not present

## 2020-03-30 DIAGNOSIS — E782 Mixed hyperlipidemia: Secondary | ICD-10-CM | POA: Diagnosis not present

## 2020-03-30 DIAGNOSIS — I10 Essential (primary) hypertension: Secondary | ICD-10-CM | POA: Diagnosis not present

## 2020-03-30 DIAGNOSIS — R69 Illness, unspecified: Secondary | ICD-10-CM | POA: Diagnosis not present

## 2020-03-30 MED ORDER — BUPROPION HCL ER (SR) 150 MG PO TB12: 150.0000 mg | ORAL_TABLET | Freq: Every day | ORAL | 0 refills | Status: DC

## 2020-03-30 NOTE — Progress Notes (Signed)
Chief Complaint:   Cassandra Palmer is here to discuss her progress with her obesity treatment plan along with follow-up of her obesity related diagnoses. Cassandra Palmer is on the Category 2 Plan and states she is following her eating plan approximately 50% of the time. Cassandra Palmer states she is doing water aerobics 45-60 minutes 6-7 times per week.  Today's visit was #: 8 Starting weight: 239 lbs Starting date: 09/17/2019 Today's weight: 225 lbs Today's date: 03/30/2020 Total lbs lost to date: 14 Total lbs lost since last in-office visit: 0  Interim History: Cassandra Palmer is up 2 lbs and has done well overall. She is having a hard time in the evening and is craving sweets.  Subjective:   Essential hypertension. Cassandra Palmer is taking Coreg. Blood pressure is reasonably well controlled.  BP Readings from Last 3 Encounters:  03/30/20 (!) 143/81  02/17/20 (!) 142/81  01/06/20 131/84   Lab Results  Component Value Date   CREATININE 0.82 07/15/2019   CREATININE 0.80 01/10/2019   CREATININE 0.99 08/05/2018   Mixed hyperlipidemia. Cassandra Palmer is taking Vytorin.  No results found for: CHOL, HDL, LDLCALC, LDLDIRECT, TRIG, CHOLHDL Lab Results  Component Value Date   ALT 11 07/15/2019   AST 16 07/15/2019   ALKPHOS 81 07/15/2019   BILITOT 0.4 07/15/2019   The ASCVD Risk score Mikey Bussing DC Jr., et al., 2013) failed to calculate for the following reasons:   Cannot find a previous HDL lab   Cannot find a previous total cholesterol lab  Other depression, with emotional eating. Cassandra Palmer is struggling with emotional eating and using food for comfort to the extent that it is negatively impacting her health. She has been working on behavior modification techniques to help reduce her emotional eating and has been somewhat successful. She shows no sign of suicidal or homicidal ideations.  Assessment/Plan:   Essential hypertension. [place] is working on healthy weight loss and exercise to improve blood  pressure control. We will watch for signs of hypotension as she continues her lifestyle modifications. She will continue her medication as directed.   Mixed hyperlipidemia. Cardiovascular risk and specific lipid/LDL goals reviewed.  We discussed several lifestyle modifications today and Cassandra Palmer will continue to work on diet, exercise and weight loss efforts. Orders and follow up as documented in patient record. She will continue her medication as directed.   Counseling Intensive lifestyle modifications are the first line treatment for this issue. . Dietary changes: Increase soluble fiber. Decrease simple carbohydrates. . Exercise changes: Moderate to vigorous-intensity aerobic activity 150 minutes per week if tolerated. . Lipid-lowering medications: see documented in medical record.  Other depression, with emotional eating. Behavior modification techniques were discussed today to help Cassandra Palmer deal with her emotional/non-hunger eating behaviors.  Orders and follow up as documented in patient record. Prescription was given for buPROPion (WELLBUTRIN SR) 150 MG 12 hr tablet 1 PO daily #30 with 0 refills.  Class 2 severe obesity with serious comorbidity and body mass index (BMI) of 38.0 to 38.9 in adult, unspecified obesity type ([place]).  Cassandra Palmer is currently in the action stage of change. As such, her goal is to continue with weight loss efforts. She has agreed to the Category 2 Plan.   She will work on meal planning and intentional eating.   Exercise goals: Cassandra Palmer will continue water aerobics 45 minutes to 1 hour 6 times per week.  Behavioral modification strategies: increasing lean protein intake, decreasing simple carbohydrates, increasing vegetables, increasing water intake, decreasing eating out, no skipping  meals, meal planning and cooking strategies, keeping healthy foods in the home and planning for success.  Cassandra Palmer has agreed to follow-up with our clinic in 5 weeks. She was informed of the  importance of frequent follow-up visits to maximize her success with intensive lifestyle modifications for her multiple health conditions.   Objective:   Blood pressure (!) 143/81, pulse 87, temperature 97.9 F (36.6 C), height 5\' 4"  (1.626 m), weight 225 lb (102.1 kg), SpO2 97 %. Body mass index is 38.62 kg/m.  General: Cooperative, alert, well developed, in no acute distress. HEENT: Conjunctivae and lids unremarkable. Cardiovascular: Regular rhythm.  Lungs: Normal work of breathing. Neurologic: No focal deficits.   Lab Results  Component Value Date   CREATININE 0.82 07/15/2019   BUN 11 07/15/2019   NA 141 07/15/2019   K 4.1 07/15/2019   CL 103 07/15/2019   CO2 28 07/15/2019   Lab Results  Component Value Date   ALT 11 07/15/2019   AST 16 07/15/2019   ALKPHOS 81 07/15/2019   BILITOT 0.4 07/15/2019   No results found for: HGBA1C No results found for: INSULIN Lab Results  Component Value Date   TSH 2.890 09/17/2019   No results found for: CHOL, HDL, LDLCALC, LDLDIRECT, TRIG, CHOLHDL Lab Results  Component Value Date   WBC 14.0 (H) 07/15/2019   HGB 13.1 07/15/2019   HCT 41.8 07/15/2019   MCV 83.4 07/15/2019   PLT 284 07/15/2019   No results found for: IRON, TIBC, FERRITIN  Obesity Behavioral Intervention Documentation for Insurance:   Approximately 15 minutes were spent on the discussion below.  ASK: We discussed the diagnosis of obesity with Cassandra Palmer today and Cassandra Palmer agreed to give [place] permission to discuss obesity behavioral modification therapy today.  ASSESS: Cassandra Palmer has the diagnosis of obesity and her BMI today is 38.7. Cassandra Palmer is in the action stage of change.   ADVISE: Cassandra Palmer was educated on the multiple health risks of obesity as well as the benefit of weight loss to improve her health. She was advised of the need for long term treatment and the importance of lifestyle modifications to improve her current health and to decrease her risk of future  health problems.  AGREE: Multiple dietary modification options and treatment options were discussed and Cassandra Palmer agreed to follow the recommendations documented in the above note.  ARRANGE: Cassandra Palmer was educated on the importance of frequent visits to treat obesity as outlined per CMS and USPSTF guidelines and agreed to schedule her next follow up appointment today.  Attestation Statements:   Reviewed by clinician on day of visit: allergies, medications, problem list, medical history, surgical history, family history, social history, and previous encounter notes.  Migdalia Dk, am acting as Location manager for [employer], DO   I have reviewed the above documentation for accuracy and completeness, and I agree with the above. Jearld Lesch, DO

## 2020-03-31 DIAGNOSIS — R69 Illness, unspecified: Secondary | ICD-10-CM | POA: Diagnosis not present

## 2020-04-28 DIAGNOSIS — D649 Anemia, unspecified: Secondary | ICD-10-CM | POA: Diagnosis not present

## 2020-04-28 DIAGNOSIS — E782 Mixed hyperlipidemia: Secondary | ICD-10-CM | POA: Diagnosis not present

## 2020-04-28 DIAGNOSIS — I10 Essential (primary) hypertension: Secondary | ICD-10-CM | POA: Diagnosis not present

## 2020-04-28 DIAGNOSIS — E039 Hypothyroidism, unspecified: Secondary | ICD-10-CM | POA: Diagnosis not present

## 2020-04-28 DIAGNOSIS — E1169 Type 2 diabetes mellitus with other specified complication: Secondary | ICD-10-CM | POA: Diagnosis not present

## 2020-04-28 DIAGNOSIS — G4700 Insomnia, unspecified: Secondary | ICD-10-CM | POA: Diagnosis not present

## 2020-05-11 ENCOUNTER — Other Ambulatory Visit: Payer: Self-pay

## 2020-05-11 ENCOUNTER — Encounter (INDEPENDENT_AMBULATORY_CARE_PROVIDER_SITE_OTHER): Payer: Self-pay | Admitting: Bariatrics

## 2020-05-11 ENCOUNTER — Ambulatory Visit (INDEPENDENT_AMBULATORY_CARE_PROVIDER_SITE_OTHER): Payer: Medicare HMO | Admitting: Bariatrics

## 2020-05-11 VITALS — BP 95/69 | HR 95 | Temp 97.4°F | Ht 64.0 in | Wt 221.0 lb

## 2020-05-11 DIAGNOSIS — Z6838 Body mass index (BMI) 38.0-38.9, adult: Secondary | ICD-10-CM | POA: Diagnosis not present

## 2020-05-11 DIAGNOSIS — E1169 Type 2 diabetes mellitus with other specified complication: Secondary | ICD-10-CM | POA: Diagnosis not present

## 2020-05-11 DIAGNOSIS — E782 Mixed hyperlipidemia: Secondary | ICD-10-CM

## 2020-05-11 DIAGNOSIS — I10 Essential (primary) hypertension: Secondary | ICD-10-CM | POA: Diagnosis not present

## 2020-05-11 DIAGNOSIS — E669 Obesity, unspecified: Secondary | ICD-10-CM | POA: Diagnosis not present

## 2020-05-11 DIAGNOSIS — E538 Deficiency of other specified B group vitamins: Secondary | ICD-10-CM

## 2020-05-11 DIAGNOSIS — R5383 Other fatigue: Secondary | ICD-10-CM

## 2020-05-11 DIAGNOSIS — E66812 Obesity, class 2: Secondary | ICD-10-CM

## 2020-05-11 DIAGNOSIS — E559 Vitamin D deficiency, unspecified: Secondary | ICD-10-CM | POA: Diagnosis not present

## 2020-05-11 DIAGNOSIS — R69 Illness, unspecified: Secondary | ICD-10-CM | POA: Diagnosis not present

## 2020-05-11 DIAGNOSIS — D508 Other iron deficiency anemias: Secondary | ICD-10-CM

## 2020-05-11 DIAGNOSIS — F3289 Other specified depressive episodes: Secondary | ICD-10-CM

## 2020-05-11 MED ORDER — BUPROPION HCL ER (SR) 200 MG PO TB12: 200.0000 mg | ORAL_TABLET | Freq: Every day | ORAL | 0 refills | Status: DC

## 2020-05-11 NOTE — Progress Notes (Signed)
Chief Complaint:   Cassandra Palmer is here to discuss her progress with her obesity treatment plan along with follow-up of her obesity related diagnoses. Cassandra Palmer is on the Category 2 Plan and states she is following her eating plan approximately 50% of the time. Cassandra Palmer states she is doing water aerobics 60-70 minutes 6-7 times per week.  Today's visit was #: 9 Starting weight: 239 lbs Starting date: 09/17/2019 Today's weight: 221 lbs Today's date: 05/11/2020 Total lbs lost to date: 18 Total lbs lost since last in-office visit: 4  Interim History: Cassandra Palmer is down an additional 4 lbs.  Subjective:   Mixed hyperlipidemia. Cassandra Palmer is taking Vytorin.  No results found for: CHOL, HDL, LDLCALC, LDLDIRECT, TRIG, CHOLHDL Lab Results  Component Value Date   ALT 11 07/15/2019   AST 16 07/15/2019   ALKPHOS 81 07/15/2019   BILITOT 0.4 07/15/2019   The ASCVD Risk score Cassandra Bussing DC Jr., et al., 2013) failed to calculate for the following reasons:   Cannot find a previous HDL lab   Cannot find a previous total cholesterol lab  Essential hypertension. Cassandra Palmer is taking Coreg.  BP Readings from Last 3 Encounters:  05/11/20 95/69  03/30/20 (!) 143/81  02/17/20 (!) 142/81   Lab Results  Component Value Date   CREATININE 0.82 07/15/2019   CREATININE 0.80 01/10/2019   CREATININE 0.99 08/05/2018   Other depression, with emotional eating. Cassandra Palmer is struggling with emotional eating and using food for comfort to the extent that it is negatively impacting her health. She has been working on behavior modification techniques to help reduce her emotional eating and has been somewhat successful. She shows no sign of suicidal or homicidal ideations.  Vitamin B12 deficiency. Cassandra Palmer is not on B12 supplementation.   No results found for: VITAMINB12  Vitamin D deficiency. Cassandra Palmer is taking Vitamin D supplementation.   Other iron deficiency anemia. Cassandra Palmer has a history of iron  deficiency anemia. She is not on iron supplementation.  CBC Latest Ref Rng & Units 07/15/2019 01/10/2019 08/05/2018  WBC 4.0 - 10.5 K/uL 14.0(H) 12.2(H) 13.9(H)  Hemoglobin 12.0 - 15.0 g/dL 13.1 13.0 13.0  Hematocrit 36 - 46 % 41.8 42.5 41.0  Platelets 150 - 400 K/uL 284 262 278   No results found for: IRON, TIBC, FERRITIN No results found for: VITAMINB12  Type 2 diabetes mellitus with obesity ([place]). Cassandra Palmer is taking Victoza, Glucophage, and [place].   No results found for: HGBA1C Lab Results  Component Value Date   CREATININE 0.82 07/15/2019   No results found for: INSULIN  Other fatigue. Cassandra Palmer notes fatigue which has been worsening over the last few months.  Assessment/Plan:   Mixed hyperlipidemia. Cardiovascular risk and specific lipid/LDL goals reviewed.  We discussed several lifestyle modifications today and Cassandra Palmer will continue to work on diet, exercise and weight loss efforts. Orders and follow up as documented in patient record. She will continue her medication as directed.   Counseling Intensive lifestyle modifications are the first line treatment for this issue. . Dietary changes: Increase soluble fiber. Decrease simple carbohydrates. . Exercise changes: Moderate to vigorous-intensity aerobic activity 150 minutes per week if tolerated. . Lipid-lowering medications: see documented in medical record.  Essential hypertension. Cassandra Palmer is working on healthy weight loss and exercise to improve blood pressure control. We will watch for signs of hypotension as she continues her lifestyle modifications. Cassandra Palmer will continue her medication as directed. Comprehensive metabolic panel ordered today.  Other depression, with emotional eating. Behavior modification  techniques were discussed today to help Cassandra Palmer deal with her emotional/non-hunger eating behaviors.  Orders and follow up as documented in patient record. Cassandra Palmer will increase buPROPion [facility] SR) to 200 MG 12 hr  tablet 1 daily #90 with 0 refills.  Vitamin B12 deficiency. The diagnosis was reviewed with the patient. Counseling provided today, see below. We will continue to monitor. Orders and follow up as documented in patient record. Vitamin B12 level ordered today.  Counseling . The body needs vitamin B12: to make red blood cells; to make DNA; and to help the nerves work properly so they can carry messages from the brain to the body.  . The main causes of vitamin B12 deficiency include dietary deficiency, digestive diseases, pernicious anemia, and having a surgery in which part of the stomach or small intestine is removed.  . Certain medicines can make it harder for the body to absorb vitamin B12. These medicines include: heartburn medications; some antibiotics; some medications used to treat diabetes, gout, and high cholesterol.  . In some cases, there are no symptoms of this condition. If the condition leads to anemia or nerve damage, various symptoms can occur, such as weakness or fatigue, shortness of breath, and numbness or tingling in your hands and feet.   . Treatment:  o May include taking vitamin B12 supplements.  o Avoid alcohol.  o Eat lots of healthy foods that contain vitamin B12: - Beef, pork, chicken, [place], and organ meats, such as liver.  - Seafood: This includes clams, rainbow trout, salmon, tuna, and haddock.  - Eggs.  - Cereal and dairy products that are fortified: This means that vitamin B12 has been added to the food.    Vitamin D deficiency. Low Vitamin D level contributes to fatigue and are associated with obesity, breast, and colon cancer. VITAMIN D 25 Hydroxy (Vit-D Deficiency, Fractures) level ordered today.  Other iron deficiency anemia. Orders and follow up as documented in patient record. Anemia panel ordered today.  Counseling . Iron is essential for our bodies to make red blood cells.  Reasons that someone may be deficient include: an iron-deficient diet (more likely  in those following vegan or vegetarian diets), women with heavy menses, patients with GI disorders or poor absorption, patients that have had bariatric surgery, frequent blood donors, patients with cancer, and patients with heart disease.   Marland Kitchen An iron supplement has been recommended. This is found over-the-counter.  Marden Noble foods include dark leafy greens, red and white meats, eggs, seafood, and beans.   . Certain foods and drinks prevent your body from absorbing iron properly. Avoid eating these foods in the same meal as iron-rich foods or with iron supplements. These foods include: coffee, black tea, and red wine; milk, dairy products, and foods that are high in calcium; beans and soybeans; whole grains.  . Constipation can be a side effect of iron supplementation. Increased water and fiber intake are helpful. Water goal: > 2 liters/day. Fiber goal: > 25 grams/day.   Type 2 diabetes mellitus with obesity ([place]). Good blood sugar control is important to decrease the likelihood of diabetic complications such as nephropathy, neuropathy, limb loss, blindness, coronary artery disease, and death. Intensive lifestyle modification including diet, exercise and weight loss are the first line of treatment for diabetes. Hemoglobin A1c, Insulin, random, Lipid Panel With LDL/HDL Ratio labs ordered today.  Other fatigue. Fatigue may be related to obesity, depression or many other causes. Labs will be ordered, and in the meanwhile, Cassandra Palmer will focus  on self care including making healthy food choices, increasing physical activity and focusing on stress reduction. T3, T4, free, TSH levels ordered today.  Class 2 severe obesity with serious comorbidity and body mass index (BMI) of 38.0 to 38.9 in adult, unspecified obesity type ([place]).  Cassandra Palmer is currently in the action stage of change. As such, her goal is to continue with weight loss efforts. She has agreed to the Category 2 Plan.   She will work on meal planning  and intentional eating.   Exercise goals: Older adults should follow the adult guidelines. When older adults cannot meet the adult guidelines, they should be as physically active as their abilities and conditions will allow.   Behavioral modification strategies: increasing lean protein intake, decreasing simple carbohydrates, increasing vegetables, increasing water intake, decreasing eating out, no skipping meals, meal planning and cooking strategies, keeping healthy foods in the home and planning for success.  Cassandra Palmer has agreed to follow-up with our clinic in 8 weeks. She was informed of the importance of frequent follow-up visits to maximize her success with intensive lifestyle modifications for her multiple health conditions.   Cassandra Palmer was informed we would discuss her lab results at her next visit unless there is a critical issue that needs to be addressed sooner. Cassandra Palmer agreed to keep her next visit at the agreed upon time to discuss these results.  Objective:   Blood pressure 95/69, pulse 95, temperature (!) 97.4 F (36.3 C), height 5\' 4"  (1.626 m), weight 221 lb (100.2 kg), SpO2 96 %. Body mass index is 37.93 kg/m.  General: Cooperative, alert, well developed, in no acute distress. HEENT: Conjunctivae and lids unremarkable. Cardiovascular: Regular rhythm.  Lungs: Normal work of breathing. Neurologic: No focal deficits.   Lab Results  Component Value Date   CREATININE 0.82 07/15/2019   BUN 11 07/15/2019   NA 141 07/15/2019   K 4.1 07/15/2019   CL 103 07/15/2019   CO2 28 07/15/2019   Lab Results  Component Value Date   ALT 11 07/15/2019   AST 16 07/15/2019   ALKPHOS 81 07/15/2019   BILITOT 0.4 07/15/2019   No results found for: HGBA1C No results found for: INSULIN Lab Results  Component Value Date   TSH 2.890 09/17/2019   No results found for: CHOL, HDL, LDLCALC, LDLDIRECT, TRIG, CHOLHDL Lab Results  Component Value Date   WBC 14.0 (H) 07/15/2019   HGB 13.1  07/15/2019   HCT 41.8 07/15/2019   MCV 83.4 07/15/2019   PLT 284 07/15/2019   No results found for: IRON, TIBC, FERRITIN  Obesity Behavioral Intervention Documentation for Insurance:   Approximately 15 minutes were spent on the discussion below.  ASK: We discussed the diagnosis of obesity with Cassandra Palmer today and Cassandra Palmer agreed to give [place] permission to discuss obesity behavioral modification therapy today.  ASSESS: Cassandra Palmer has the diagnosis of obesity and her BMI today is 38.0. Cassandra Palmer is in the action stage of change.   ADVISE: Cassandra Palmer was educated on the multiple health risks of obesity as well as the benefit of weight loss to improve her health. She was advised of the need for long term treatment and the importance of lifestyle modifications to improve her current health and to decrease her risk of future health problems.  AGREE: Multiple dietary modification options and treatment options were discussed and Diana agreed to follow the recommendations documented in the above note.  ARRANGE: Princess was educated on the importance of frequent visits to treat obesity as outlined per  CMS and USPSTF guidelines and agreed to schedule her next follow up appointment today.  Attestation Statements:   Reviewed by clinician on day of visit: allergies, medications, problem list, medical history, surgical history, family history, social history, and previous encounter notes.  Migdalia Dk, am acting as Location manager for [employer], DO   I have reviewed the above documentation for accuracy and completeness, and I agree with the above. Jearld Lesch, DO

## 2020-05-12 LAB — TSH: TSH: 5.11 u[IU]/mL — ABNORMAL HIGH (ref 0.450–4.500)

## 2020-05-12 LAB — T3: T3, Total: 100 ng/dL (ref 71–180)

## 2020-05-12 LAB — COMPREHENSIVE METABOLIC PANEL
ALT: 16 IU/L (ref 0–32)
AST: 21 IU/L (ref 0–40)
Albumin/Globulin Ratio: 2.2 (ref 1.2–2.2)
Albumin: 4.6 g/dL (ref 3.7–4.7)
Alkaline Phosphatase: 103 IU/L (ref 48–121)
BUN/Creatinine Ratio: 18 (ref 12–28)
BUN: 16 mg/dL (ref 8–27)
Bilirubin Total: 0.2 mg/dL (ref 0.0–1.2)
CO2: 22 mmol/L (ref 20–29)
Calcium: 9.6 mg/dL (ref 8.7–10.3)
Chloride: 99 mmol/L (ref 96–106)
Creatinine, Ser: 0.88 mg/dL (ref 0.57–1.00)
GFR calc Af Amer: 76 mL/min/{1.73_m2} (ref >59)
GFR calc non Af Amer: 66 mL/min/{1.73_m2} (ref >59)
Globulin, Total: 2.1 g/dL (ref 1.5–4.5)
Glucose: 115 mg/dL — ABNORMAL HIGH (ref 65–99)
Potassium: 4.4 mmol/L (ref 3.5–5.2)
Sodium: 139 mmol/L (ref 134–144)
Total Protein: 6.7 g/dL (ref 6.0–8.5)

## 2020-05-12 LAB — HEMOGLOBIN A1C
Est. average glucose Bld gHb Est-mCnc: 157 mg/dL
Hgb A1c MFr Bld: 7.1 % — ABNORMAL HIGH (ref 4.8–5.6)

## 2020-05-12 LAB — LIPID PANEL WITH LDL/HDL RATIO
Cholesterol, Total: 147 mg/dL (ref 100–199)
HDL: 47 mg/dL (ref >39)
LDL Chol Calc (NIH): 76 mg/dL (ref 0–99)
LDL/HDL Ratio: 1.6 ratio (ref 0.0–3.2)
Triglycerides: 140 mg/dL (ref 0–149)
VLDL Cholesterol Cal: 24 mg/dL (ref 5–40)

## 2020-05-12 LAB — ANEMIA PANEL
Ferritin: 14 ng/mL — ABNORMAL LOW (ref 15–150)
Folate, Hemolysate: 522 ng/mL
Folate, RBC: 1246 ng/mL (ref >498)
Hematocrit: 41.9 % (ref 34.0–46.6)
Iron Saturation: 9 % — CL (ref 15–55)
Iron: 40 ug/dL (ref 27–139)
Retic Ct Pct: 1.8 % (ref 0.6–2.6)
Total Iron Binding Capacity: 422 ug/dL (ref 250–450)
UIBC: 382 ug/dL — ABNORMAL HIGH (ref 118–369)
Vitamin B-12: 349 pg/mL (ref 232–1245)

## 2020-05-12 LAB — T4, FREE: Free T4: 1.44 ng/dL (ref 0.82–1.77)

## 2020-05-12 LAB — VITAMIN D 25 HYDROXY (VIT D DEFICIENCY, FRACTURES): Vit D, 25-Hydroxy: 103 ng/mL — ABNORMAL HIGH (ref 30.0–100.0)

## 2020-05-12 LAB — INSULIN, RANDOM: INSULIN: 4.3 u[IU]/mL (ref 2.6–24.9)

## 2020-05-13 NOTE — Progress Notes (Signed)
Per pt her colonoscopy was normal a couple of years ago. 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 Renee Ramus, LPN

## 2020-05-13 NOTE — Progress Notes (Signed)
Verbalized understanding to stop taking vitamin D. Renee Ramus, LPN

## 2020-06-29 DIAGNOSIS — R69 Illness, unspecified: Secondary | ICD-10-CM | POA: Diagnosis not present

## 2020-07-01 ENCOUNTER — Other Ambulatory Visit (INDEPENDENT_AMBULATORY_CARE_PROVIDER_SITE_OTHER): Payer: Self-pay | Admitting: Bariatrics

## 2020-07-06 ENCOUNTER — Ambulatory Visit (INDEPENDENT_AMBULATORY_CARE_PROVIDER_SITE_OTHER): Payer: Medicare HMO | Admitting: Bariatrics

## 2020-07-06 ENCOUNTER — Encounter (INDEPENDENT_AMBULATORY_CARE_PROVIDER_SITE_OTHER): Payer: Self-pay | Admitting: Bariatrics

## 2020-07-06 ENCOUNTER — Other Ambulatory Visit: Payer: Self-pay

## 2020-07-06 VITALS — BP 133/84 | HR 90 | Temp 97.6°F | Ht 64.0 in | Wt 214.0 lb

## 2020-07-06 DIAGNOSIS — E669 Obesity, unspecified: Secondary | ICD-10-CM | POA: Diagnosis not present

## 2020-07-06 DIAGNOSIS — E1169 Type 2 diabetes mellitus with other specified complication: Secondary | ICD-10-CM

## 2020-07-06 DIAGNOSIS — Z6836 Body mass index (BMI) 36.0-36.9, adult: Secondary | ICD-10-CM | POA: Diagnosis not present

## 2020-07-06 DIAGNOSIS — F3289 Other specified depressive episodes: Secondary | ICD-10-CM | POA: Diagnosis not present

## 2020-07-06 DIAGNOSIS — R69 Illness, unspecified: Secondary | ICD-10-CM | POA: Diagnosis not present

## 2020-07-06 DIAGNOSIS — E559 Vitamin D deficiency, unspecified: Secondary | ICD-10-CM | POA: Diagnosis not present

## 2020-07-06 MED ORDER — BUPROPION HCL ER (SR) 200 MG PO TB12: 200.0000 mg | ORAL_TABLET | Freq: Every day | ORAL | 0 refills | Status: DC

## 2020-07-06 NOTE — Progress Notes (Signed)
Chief Complaint:   Cassandra Palmer is here to discuss her progress with her obesity treatment plan along with follow-up of her obesity related diagnoses. Cassandra Palmer is on the Category 2 Plan and states she is following her eating plan approximately 50% of the time. Cassandra Palmer states she is doing water aerobics 45-60 minutes 6 times per week.  Today's visit was #: 10 Starting weight: 239 lbs Starting date: 09/17/2019 Today's weight: 214 lbs Today's date: 07/06/2020 Total lbs lost to date: 25 Total lbs lost since last in-office visit: 7  Interim History: Cassandra Palmer is down 7 lbs since her last office visit.  Subjective:   Other depression, with emotional eating. Cassandra Palmer is struggling with emotional eating and using food for comfort to the extent that it is negatively impacting her health. She has been working on behavior modification techniques to help reduce her emotional eating and has been somewhat successful. She shows no sign of suicidal or homicidal ideations. Cassandra Palmer is taking Wellbutrin, which she states is helping with cravings. She denies side effects.  Vitamin D deficiency.    Ref. Range 05/11/2020 13:35  Vitamin D, 25-Hydroxy Latest Ref Range: 30.0 - 100.0 ng/mL 103.0 (H)   Type 2 diabetes mellitus with obesity ([place]). Cassandra Palmer is taking [place], Victoza, and metformin.    Lab Results  Component Value Date   HGBA1C 7.1 (H) 05/11/2020   Lab Results  Component Value Date   LDLCALC 76 05/11/2020   CREATININE 0.88 05/11/2020   Lab Results  Component Value Date   INSULIN 4.3 05/11/2020   Assessment/Plan:   Other depression, with emotional eating. Behavior modification techniques were discussed today to help Cassandra Palmer deal with her emotional/non-hunger eating behaviors.  Orders and follow up as documented in patient record. Prescription was given for buPROPion (WELLBUTRIN SR) 200 MG 12 hr tablet 1 daily #30 with 0 refills.  Vitamin D deficiency. Low Vitamin D level  contributes to fatigue and are associated with obesity, breast, and colon cancer. VITAMIN D 25 Hydroxy (Vit-D Deficiency, Fractures) level will be checked today.   Type 2 diabetes mellitus with obesity ([place]). Good blood sugar control is important to decrease the likelihood of diabetic complications such as nephropathy, neuropathy, limb loss, blindness, coronary artery disease, and death. Intensive lifestyle modification including diet, exercise and weight loss are the first line of treatment for diabetes. Sugar will continue her medications as directed.   Class 2 severe obesity with serious comorbidity and body mass index (BMI) of 36.0 to 36.9 in adult, unspecified obesity type ([place]).  Cassandra Palmer is currently in the action stage of change. As such, her goal is to continue with weight loss efforts. She has agreed to the Category 2 Plan.    She will work on meal planning and intentional eating.   Exercise goals: Older adults should follow the adult guidelines. When older adults cannot meet the adult guidelines, they should be as physically active as their abilities and conditions will allow.   Behavioral modification strategies: increasing lean protein intake, decreasing simple carbohydrates, increasing vegetables, increasing water intake, decreasing eating out, no skipping meals, meal planning and cooking strategies, keeping healthy foods in the home and planning for success.  Cassandra Palmer has agreed to follow-up with our clinic in 8 weeks. She was informed of the importance of frequent follow-up visits to maximize her success with intensive lifestyle modifications for her multiple health conditions.   Cassandra Palmer was informed we would discuss her lab results at her next visit unless there is  a critical issue that needs to be addressed sooner. Cassandra Palmer agreed to keep her next visit at the agreed upon time to discuss these results.  Objective:   Blood pressure 133/84, pulse 90, temperature 97.6 F (36.4 C),  height 5\' 4"  (1.626 m), weight 214 lb (97.1 kg), SpO2 94 %. Body mass index is 36.73 kg/m.  General: Cooperative, alert, well developed, in no acute distress. HEENT: Conjunctivae and lids unremarkable. Cardiovascular: Regular rhythm.  Lungs: Normal work of breathing. Neurologic: No focal deficits.   Lab Results  Component Value Date   CREATININE 0.88 05/11/2020   BUN 16 05/11/2020   NA 139 05/11/2020   K 4.4 05/11/2020   CL 99 05/11/2020   CO2 22 05/11/2020   Lab Results  Component Value Date   ALT 16 05/11/2020   AST 21 05/11/2020   ALKPHOS 103 05/11/2020   BILITOT 0.2 05/11/2020   Lab Results  Component Value Date   HGBA1C 7.1 (H) 05/11/2020   Lab Results  Component Value Date   INSULIN 4.3 05/11/2020   Lab Results  Component Value Date   TSH 5.110 (H) 05/11/2020   Lab Results  Component Value Date   CHOL 147 05/11/2020   HDL 47 05/11/2020   LDLCALC 76 05/11/2020   TRIG 140 05/11/2020   Lab Results  Component Value Date   WBC 14.0 (H) 07/15/2019   HGB 13.1 07/15/2019   HCT 41.9 05/11/2020   MCV 83.4 07/15/2019   PLT 284 07/15/2019   Lab Results  Component Value Date   IRON 40 05/11/2020   TIBC 422 05/11/2020   FERRITIN 14 (L) 05/11/2020   Obesity Behavioral Intervention Documentation for Insurance:   Approximately 15 minutes were spent on the discussion below.  ASK: We discussed the diagnosis of obesity with Cassandra Palmer today and Cassandra Palmer agreed to give [place] permission to discuss obesity behavioral modification therapy today.  ASSESS: Cassandra Palmer has the diagnosis of obesity and her BMI today is 36.8. Cassandra Palmer is in the action stage of change.   ADVISE: Cassandra Palmer was educated on the multiple health risks of obesity as well as the benefit of weight loss to improve her health. She was advised of the need for long term treatment and the importance of lifestyle modifications to improve her current health and to decrease her risk of future health  problems.  AGREE: Multiple dietary modification options and treatment options were discussed and Cassandra Palmer agreed to follow the recommendations documented in the above note.  ARRANGE: Cassandra Palmer was educated on the importance of frequent visits to treat obesity as outlined per CMS and USPSTF guidelines and agreed to schedule her next follow up appointment today.  Attestation Statements:   Reviewed by clinician on day of visit: allergies, medications, problem list, medical history, surgical history, family history, social history, and previous encounter notes.  Migdalia Dk, am acting as Location manager for [employer], DO   I have reviewed the above documentation for accuracy and completeness, and I agree with the above. Jearld Lesch, DO

## 2020-07-07 ENCOUNTER — Ambulatory Visit (INDEPENDENT_AMBULATORY_CARE_PROVIDER_SITE_OTHER): Payer: Medicare HMO | Admitting: Bariatrics

## 2020-07-07 LAB — VITAMIN D 25 HYDROXY (VIT D DEFICIENCY, FRACTURES): Vit D, 25-Hydroxy: 64.3 ng/mL (ref 30.0–100.0)

## 2020-07-07 NOTE — Progress Notes (Signed)
Pt advised and will take 2000 IU OTC

## 2020-08-02 DIAGNOSIS — D649 Anemia, unspecified: Secondary | ICD-10-CM | POA: Diagnosis not present

## 2020-08-02 DIAGNOSIS — I10 Essential (primary) hypertension: Secondary | ICD-10-CM | POA: Diagnosis not present

## 2020-08-02 DIAGNOSIS — E039 Hypothyroidism, unspecified: Secondary | ICD-10-CM | POA: Diagnosis not present

## 2020-08-02 DIAGNOSIS — E782 Mixed hyperlipidemia: Secondary | ICD-10-CM | POA: Diagnosis not present

## 2020-08-02 DIAGNOSIS — E559 Vitamin D deficiency, unspecified: Secondary | ICD-10-CM | POA: Diagnosis not present

## 2020-08-02 DIAGNOSIS — E1169 Type 2 diabetes mellitus with other specified complication: Secondary | ICD-10-CM | POA: Diagnosis not present

## 2020-08-04 DIAGNOSIS — Z23 Encounter for immunization: Secondary | ICD-10-CM | POA: Diagnosis not present

## 2020-08-04 DIAGNOSIS — D649 Anemia, unspecified: Secondary | ICD-10-CM | POA: Diagnosis not present

## 2020-08-04 DIAGNOSIS — E782 Mixed hyperlipidemia: Secondary | ICD-10-CM | POA: Diagnosis not present

## 2020-08-04 DIAGNOSIS — E559 Vitamin D deficiency, unspecified: Secondary | ICD-10-CM | POA: Diagnosis not present

## 2020-08-04 DIAGNOSIS — E039 Hypothyroidism, unspecified: Secondary | ICD-10-CM | POA: Diagnosis not present

## 2020-08-04 DIAGNOSIS — Z0000 Encounter for general adult medical examination without abnormal findings: Secondary | ICD-10-CM | POA: Diagnosis not present

## 2020-08-04 DIAGNOSIS — J309 Allergic rhinitis, unspecified: Secondary | ICD-10-CM | POA: Diagnosis not present

## 2020-08-04 DIAGNOSIS — I10 Essential (primary) hypertension: Secondary | ICD-10-CM | POA: Diagnosis not present

## 2020-08-04 DIAGNOSIS — E1169 Type 2 diabetes mellitus with other specified complication: Secondary | ICD-10-CM | POA: Diagnosis not present

## 2020-08-04 DIAGNOSIS — C9110 Chronic lymphocytic leukemia of B-cell type not having achieved remission: Secondary | ICD-10-CM | POA: Diagnosis not present

## 2020-08-17 DIAGNOSIS — M25562 Pain in left knee: Secondary | ICD-10-CM | POA: Diagnosis not present

## 2020-08-31 ENCOUNTER — Other Ambulatory Visit: Payer: Self-pay

## 2020-08-31 ENCOUNTER — Encounter (INDEPENDENT_AMBULATORY_CARE_PROVIDER_SITE_OTHER): Payer: Self-pay | Admitting: Bariatrics

## 2020-08-31 ENCOUNTER — Ambulatory Visit (INDEPENDENT_AMBULATORY_CARE_PROVIDER_SITE_OTHER): Payer: Medicare HMO | Admitting: Bariatrics

## 2020-08-31 VITALS — BP 128/78 | HR 81 | Temp 98.0°F | Ht 64.0 in | Wt 208.0 lb

## 2020-08-31 DIAGNOSIS — E1169 Type 2 diabetes mellitus with other specified complication: Secondary | ICD-10-CM

## 2020-08-31 DIAGNOSIS — I10 Essential (primary) hypertension: Secondary | ICD-10-CM | POA: Diagnosis not present

## 2020-08-31 DIAGNOSIS — F3289 Other specified depressive episodes: Secondary | ICD-10-CM | POA: Diagnosis not present

## 2020-08-31 DIAGNOSIS — Z6835 Body mass index (BMI) 35.0-35.9, adult: Secondary | ICD-10-CM

## 2020-08-31 DIAGNOSIS — R69 Illness, unspecified: Secondary | ICD-10-CM | POA: Diagnosis not present

## 2020-08-31 MED ORDER — BUPROPION HCL ER (SR) 200 MG PO TB12: 200.0000 mg | ORAL_TABLET | Freq: Every day | ORAL | 0 refills | Status: DC

## 2020-08-31 NOTE — Progress Notes (Signed)
Chief Complaint:   Cassandra Palmer is here to discuss her progress with her obesity treatment plan along with follow-up of her obesity related diagnoses. Cassandra Palmer is on the Category 2 Plan and states she is following her eating plan approximately 50% of the time. Cassandra Palmer states she is doing water aerobics 60 minutes 5-6 times per week.  Today's visit was #: 11 Starting weight: 239 lbs Starting date: 09/17/2019 Today's weight: 208 lbs Today's date: 08/31/2020 Total lbs lost to date: 31 Total lbs lost since last in-office visit: 6  Interim History: Cassandra Palmer is down 6 lbs and has done well overall. She is cutting back on her carbohydrates and reports doing well with her water intake.  Subjective:   Other depression, with emotional eating. Cassandra Palmer is struggling with emotional eating and using food for comfort to the extent that it is negatively impacting her health. She has been working on behavior modification techniques to help reduce her emotional eating and has been somewhat successful. She shows no sign of suicidal or homicidal ideations. Cassandra Palmer is on Wellbutrin, which she is tolerating well.  Essential hypertension. Blood pressure is controlled.  BP Readings from Last 3 Encounters:  08/31/20 128/78  07/06/20 133/84  05/11/20 95/69   Lab Results  Component Value Date   CREATININE 0.88 05/11/2020   CREATININE 0.82 07/15/2019   CREATININE 0.80 01/10/2019   Type 2 diabetes mellitus with other specified complication, without long-term current use of insulin (Ro[place]). Berry had recent labs showing an A1c of 6.8. She is taking [place], Victoza, and metformin.   Lab Results  Component Value Date   HGBA1C 7.1 (H) 05/11/2020   Lab Results  Component Value Date   LDLCALC 76 05/11/2020   CREATININE 0.88 05/11/2020   Lab Results  Component Value Date   INSULIN 4.3 05/11/2020   Assessment/Plan:   Other depression, with emotional eating. Behavior modification  techniques were discussed today to help Cassandra Palmer deal with her emotional/non-hunger eating behaviors.  Orders and follow up as documented in patient record. Refill was given for buPROPion (WELLBUTRIN SR) 200 MG 12 hr tablet 1 daily #90 with 0 refills.  Essential hypertension. Cassandra Palmer is working on healthy weight loss and exercise to improve blood pressure control. We will watch for signs of hypotension as she continues her lifestyle modifications. She will continue her medication as directed.   Type 2 diabetes mellitus with other specified complication, without long-term current use of insulin (Cassandra Palmer). Good blood sugar control is important to decrease the likelihood of diabetic complications such as nephropathy, neuropathy, limb loss, blindness, coronary artery disease, and death. Intensive lifestyle modification including diet, exercise and weight loss are the first line of treatment for diabetes. Cassandra Palmer will continue her medications as directed.   Class 2 severe obesity with serious comorbidity and body mass index (BMI) of 35.0 to 35.9 in adult, unspecified obesity type (Cassandra Palmer).  Cassandra Palmer is currently in the action stage of change. As such, her goal is to continue with weight loss efforts. She has agreed to the Category 2 Plan.   She will work on meal planning and intentional eating.   Exercise goals: Cassandra Palmer will continue her water aerobics for exercise.  Behavioral modification strategies: increasing lean protein intake, decreasing simple carbohydrates, increasing vegetables, increasing water intake, decreasing eating out, no skipping meals, meal planning and cooking strategies, keeping healthy foods in the home and planning for success.  Cassandra Palmer has agreed to follow-up with our clinic in 3 months (January 15th or  later). She was informed of the importance of frequent follow-up visits to maximize her success with intensive lifestyle modifications for her multiple health conditions.   Objective:    Blood pressure 128/78, pulse 81, temperature 98 F (36.7 C), height 5\' 4"  (1.626 m), weight 208 lb (94.3 kg), SpO2 98 %. Body mass index is 35.7 kg/m.  General: Cooperative, alert, well developed, in no acute distress. HEENT: Conjunctivae and lids unremarkable. Cardiovascular: Regular rhythm.  Lungs: Normal work of breathing. Neurologic: No focal deficits.   Lab Results  Component Value Date   CREATININE 0.88 05/11/2020   BUN 16 05/11/2020   NA 139 05/11/2020   K 4.4 05/11/2020   CL 99 05/11/2020   CO2 22 05/11/2020   Lab Results  Component Value Date   ALT 16 05/11/2020   AST 21 05/11/2020   ALKPHOS 103 05/11/2020   BILITOT 0.2 05/11/2020   Lab Results  Component Value Date   HGBA1C 7.1 (H) 05/11/2020   Lab Results  Component Value Date   INSULIN 4.3 05/11/2020   Lab Results  Component Value Date   TSH 5.110 (H) 05/11/2020   Lab Results  Component Value Date   CHOL 147 05/11/2020   HDL 47 05/11/2020   LDLCALC 76 05/11/2020   TRIG 140 05/11/2020   Lab Results  Component Value Date   WBC 14.0 (H) 07/15/2019   HGB 13.1 07/15/2019   HCT 41.9 05/11/2020   MCV 83.4 07/15/2019   PLT 284 07/15/2019   Lab Results  Component Value Date   IRON 40 05/11/2020   TIBC 422 05/11/2020   FERRITIN 14 (L) 05/11/2020   Obesity Behavioral Intervention:   Approximately 15 minutes were spent on the discussion below.  ASK: We discussed the diagnosis of obesity with Cassandra Palmer today and Cassandra Palmer agreed to give Cassandra Palmer permission to discuss obesity behavioral modification therapy today.  ASSESS: Cassandra Palmer has the diagnosis of obesity and her BMI today is 35.8. Cassandra Palmer is in the action stage of change.   ADVISE: Cassandra Palmer was educated on the multiple health risks of obesity as well as the benefit of weight loss to improve her health. She was advised of the need for long term treatment and the importance of lifestyle modifications to improve her current health and to decrease her  risk of future health problems.  AGREE: Multiple dietary modification options and treatment options were discussed and Cassandra Palmer agreed to follow the recommendations documented in the above note.  ARRANGE: Cassandra Palmer was educated on the importance of frequent visits to treat obesity as outlined per CMS and USPSTF guidelines and agreed to schedule her next follow up appointment today.  Attestation Statements:   Reviewed by clinician on day of visit: allergies, medications, problem list, medical history, surgical history, family history, social history, and previous encounter notes.  Cassandra Palmer, am acting as Location manager for [employer], DO   I have reviewed the above documentation for accuracy and completeness, and I agree with the above. Jearld Lesch, DO

## 2020-09-01 ENCOUNTER — Encounter (INDEPENDENT_AMBULATORY_CARE_PROVIDER_SITE_OTHER): Payer: Self-pay | Admitting: Bariatrics

## 2020-09-29 DIAGNOSIS — R69 Illness, unspecified: Secondary | ICD-10-CM | POA: Diagnosis not present

## 2020-10-14 ENCOUNTER — Other Ambulatory Visit (INDEPENDENT_AMBULATORY_CARE_PROVIDER_SITE_OTHER): Payer: Self-pay | Admitting: Bariatrics

## 2020-10-14 DIAGNOSIS — F3289 Other specified depressive episodes: Secondary | ICD-10-CM

## 2020-10-18 NOTE — Telephone Encounter (Signed)
This patient was last seen by Dr. Owens Shark, and currently has an upcoming appt scheduled on 11/16/20 with her.

## 2020-10-18 NOTE — Telephone Encounter (Signed)
Refill request

## 2020-11-16 ENCOUNTER — Ambulatory Visit (INDEPENDENT_AMBULATORY_CARE_PROVIDER_SITE_OTHER): Payer: Medicare HMO | Admitting: Bariatrics

## 2020-11-23 ENCOUNTER — Ambulatory Visit (INDEPENDENT_AMBULATORY_CARE_PROVIDER_SITE_OTHER): Payer: Medicare HMO | Admitting: Bariatrics

## 2020-11-23 ENCOUNTER — Encounter (INDEPENDENT_AMBULATORY_CARE_PROVIDER_SITE_OTHER): Payer: Self-pay | Admitting: Bariatrics

## 2020-11-23 ENCOUNTER — Other Ambulatory Visit: Payer: Self-pay

## 2020-11-23 VITALS — BP 115/72 | HR 73 | Temp 97.4°F | Ht 64.0 in | Wt 204.0 lb

## 2020-11-23 DIAGNOSIS — F5089 Other specified eating disorder: Secondary | ICD-10-CM | POA: Diagnosis not present

## 2020-11-23 DIAGNOSIS — I10 Essential (primary) hypertension: Secondary | ICD-10-CM

## 2020-11-23 DIAGNOSIS — R69 Illness, unspecified: Secondary | ICD-10-CM | POA: Diagnosis not present

## 2020-11-23 DIAGNOSIS — Z6835 Body mass index (BMI) 35.0-35.9, adult: Secondary | ICD-10-CM

## 2020-11-23 DIAGNOSIS — F3289 Other specified depressive episodes: Secondary | ICD-10-CM

## 2020-11-23 MED ORDER — BUPROPION HCL ER (SR) 200 MG PO TB12: 200.0000 mg | ORAL_TABLET | Freq: Every day | ORAL | 0 refills | Status: DC

## 2020-11-24 ENCOUNTER — Encounter (INDEPENDENT_AMBULATORY_CARE_PROVIDER_SITE_OTHER): Payer: Self-pay | Admitting: Bariatrics

## 2020-11-24 NOTE — Progress Notes (Signed)
Chief Complaint:   OBESITY Cassandra Palmer is here to discuss her progress with her obesity treatment plan along with follow-up of her obesity related diagnoses. Cassandra Palmer is on the Category 2 Plan and states she is following her eating plan approximately 35% of the time. Cassandra Palmer states she is doing water aerobics for 60 minutes 3-4 times per week.  Today's visit was #: 12 Starting weight: 239 lbs Starting date: 09/17/2019 Today's weight: 204 lbs Today's date: 11/23/2020 Total lbs lost to date: 35 lbs Total lbs lost since last in-office visit: 4 lbs  Interim History: Cassandra Palmer is down 4 pounds since her last visit on 08/31/2020.  Her goal is to get down to 200 pounds.  Subjective:   1. Essential hypertension Controlled.  Review: taking medications as instructed, no medication side effects noted, no chest pain on exertion, no dyspnea on exertion, no swelling of ankles.    BP Readings from Last 3 Encounters:  11/23/20 115/72  08/31/20 128/78  07/06/20 133/84   2. Other disorder of eating Cassandra Palmer is taking Wellbutrin 200 mg daily.  Assessment/Plan:   1. Essential hypertension Cassandra Palmer is working on healthy weight loss and exercise to improve blood pressure control. We will watch for signs of hypotension as she continues her lifestyle modifications.  Continue medication.  2. Other disorder of eating Will refill Wellbutrin today, as per below.  Move Wellbutrin to lunch.  -Refill buPROPion (WELLBUTRIN SR) 200 MG 12 hr tablet; Take 1 tablet (200 mg total) by mouth daily.  Dispense: 90 tablet; Refill: 0  3. Class 2 severe obesity with serious comorbidity and body mass index (BMI) of 35.0 to 35.9 in adult, unspecified obesity type Cassandra Palmer)  Cassandra Palmer is currently in the action stage of change. As such, her goal is to continue with weight loss efforts. She has agreed to keeping a food journal and adhering to recommended goals of 1200 calories and 70-80 grams of protein.   She will work on meal  planning and keeping her protein and water intake up.  Recipes II handout was provided today.  Exercise goals: All adults should avoid inactivity. Some physical activity is better than none, and adults who participate in any amount of physical activity gain some health benefits. Swimming 3-4 times per week.  Behavioral modification strategies: increasing lean protein intake, decreasing simple carbohydrates, increasing vegetables, increasing water intake, decreasing eating out, no skipping meals, meal planning and cooking strategies, keeping healthy foods in the home and planning for success.  Cassandra Palmer has agreed to follow-up with our clinic in 8 weeks. She was informed of the importance of frequent follow-up visits to maximize her success with intensive lifestyle modifications for her multiple health conditions.   Objective:   Blood pressure 115/72, pulse 73, temperature (!) 97.4 F (36.3 Palmer), weight 204 lb (92.5 kg), SpO2 95 %. Body mass index is 35.02 kg/m.  General: Cooperative, alert, well developed, in no acute distress. HEENT: Conjunctivae and lids unremarkable. Cardiovascular: Regular rhythm.  Lungs: Normal work of breathing. Neurologic: No focal deficits.   Lab Results  Component Value Date   CREATININE 0.88 05/11/2020   BUN 16 05/11/2020   NA 139 05/11/2020   K 4.4 05/11/2020   CL 99 05/11/2020   CO2 22 05/11/2020   Lab Results  Component Value Date   ALT 16 05/11/2020   AST 21 05/11/2020   ALKPHOS 103 05/11/2020   BILITOT 0.2 05/11/2020   Lab Results  Component Value Date   HGBA1C 7.1 (H) 05/11/2020  Lab Results  Component Value Date   INSULIN 4.3 05/11/2020   Lab Results  Component Value Date   TSH 5.110 (H) 05/11/2020   Lab Results  Component Value Date   CHOL 147 05/11/2020   HDL 47 05/11/2020   LDLCALC 76 05/11/2020   TRIG 140 05/11/2020   Lab Results  Component Value Date   WBC 14.0 (H) 07/15/2019   HGB 13.1 07/15/2019   HCT 41.9 05/11/2020    MCV 83.4 07/15/2019   PLT 284 07/15/2019   Lab Results  Component Value Date   IRON 40 05/11/2020   TIBC 422 05/11/2020   FERRITIN 14 (L) 05/11/2020   Obesity Behavioral Intervention:   Approximately 15 minutes were spent on the discussion below.  ASK: We discussed the diagnosis of obesity with Cassandra Palmer today and Cassandra Palmer agreed to give [place] permission to discuss obesity behavioral modification therapy today.  ASSESS: Cassandra Palmer has the diagnosis of obesity and her BMI today is 35.0. Ece is in the action stage of change.   ADVISE: Cassandra Palmer was educated on the multiple health risks of obesity as well as the benefit of weight loss to improve her health. She was advised of the need for long term treatment and the importance of lifestyle modifications to improve her current health and to decrease her risk of future health problems.  AGREE: Multiple dietary modification options and treatment options were discussed and Cassandra Palmer agreed to follow the recommendations documented in the above note.  ARRANGE: Cassandra Palmer was educated on the importance of frequent visits to treat obesity as outlined per CMS and USPSTF guidelines and agreed to schedule her next follow up appointment today.  Attestation Statements:   Reviewed by clinician on day of visit: allergies, medications, problem list, medical history, surgical history, family history, social history, and previous encounter notes.  I, Water quality scientist, CMA, am acting as Location manager for [employer], DO  I have reviewed the above documentation for accuracy and completeness, and I agree with the above. Jearld Lesch, DO

## 2020-12-08 DIAGNOSIS — Z1231 Encounter for screening mammogram for malignant neoplasm of breast: Secondary | ICD-10-CM | POA: Diagnosis not present

## 2021-01-17 ENCOUNTER — Ambulatory Visit (INDEPENDENT_AMBULATORY_CARE_PROVIDER_SITE_OTHER): Payer: Medicare HMO | Admitting: Bariatrics

## 2021-01-18 ENCOUNTER — Ambulatory Visit (INDEPENDENT_AMBULATORY_CARE_PROVIDER_SITE_OTHER): Payer: Medicare HMO | Admitting: Bariatrics

## 2021-02-02 ENCOUNTER — Ambulatory Visit (INDEPENDENT_AMBULATORY_CARE_PROVIDER_SITE_OTHER): Payer: Medicare HMO | Admitting: Bariatrics

## 2021-02-02 DIAGNOSIS — Z23 Encounter for immunization: Secondary | ICD-10-CM | POA: Diagnosis not present

## 2021-02-02 DIAGNOSIS — E039 Hypothyroidism, unspecified: Secondary | ICD-10-CM | POA: Diagnosis not present

## 2021-02-02 DIAGNOSIS — D649 Anemia, unspecified: Secondary | ICD-10-CM | POA: Diagnosis not present

## 2021-02-02 DIAGNOSIS — J309 Allergic rhinitis, unspecified: Secondary | ICD-10-CM | POA: Diagnosis not present

## 2021-02-02 DIAGNOSIS — C9110 Chronic lymphocytic leukemia of B-cell type not having achieved remission: Secondary | ICD-10-CM | POA: Diagnosis not present

## 2021-02-02 DIAGNOSIS — E1169 Type 2 diabetes mellitus with other specified complication: Secondary | ICD-10-CM | POA: Diagnosis not present

## 2021-02-02 DIAGNOSIS — G4700 Insomnia, unspecified: Secondary | ICD-10-CM | POA: Diagnosis not present

## 2021-02-02 DIAGNOSIS — I10 Essential (primary) hypertension: Secondary | ICD-10-CM | POA: Diagnosis not present

## 2021-02-02 DIAGNOSIS — E782 Mixed hyperlipidemia: Secondary | ICD-10-CM | POA: Diagnosis not present

## 2021-02-02 DIAGNOSIS — E559 Vitamin D deficiency, unspecified: Secondary | ICD-10-CM | POA: Diagnosis not present

## 2021-02-03 IMAGING — CT CT CHEST W/O CM
2 of 4 series · 15 of 36 positions shown, 18 images · non-contrast
Comparison: No priors.

CLINICAL DATA: 72-year-old female with history of pulmonary nodule.

EXAM:
CT CHEST WITHOUT CONTRAST
TECHNIQUE: Multidetector CT imaging of the chest was performed following the
standard protocol without IV contrast.

[Series 2: thorax · axial · 0.69mm/px · z∈[-290,-48]mm · 12 of 143 slices shown, 15 images]
[im 11/143  mediastinal]
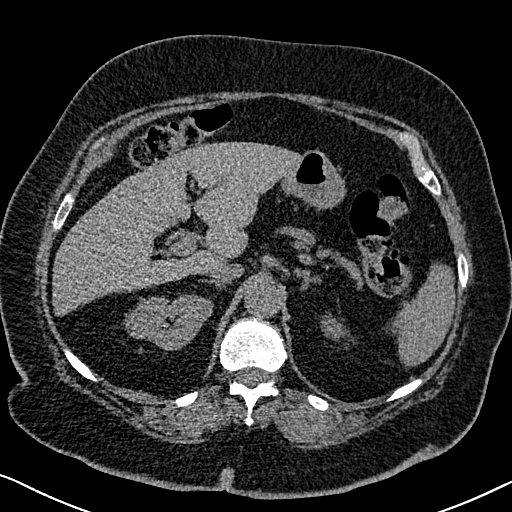
[im 11/143  lung]
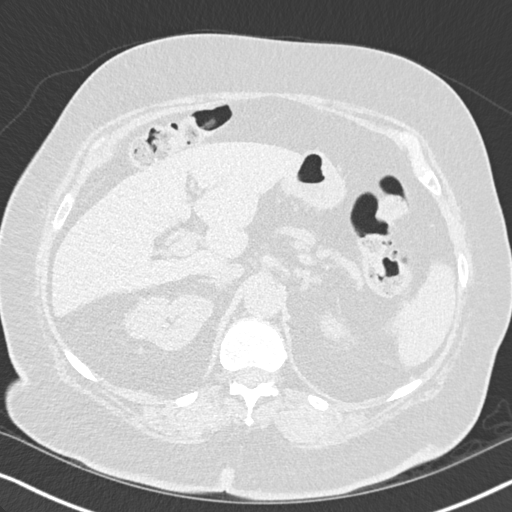
[im 21/143  lung]
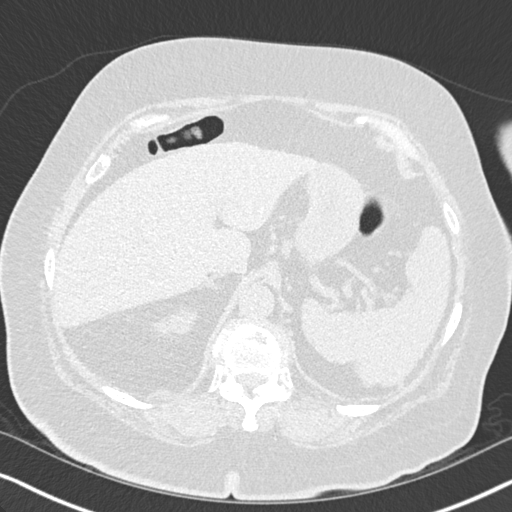
[im 31/143  lung]
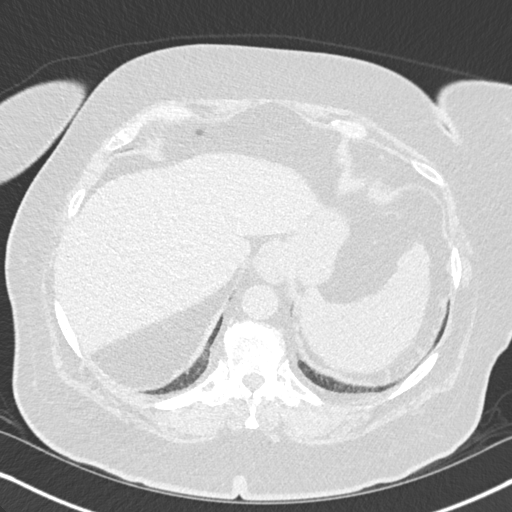
[im 41/143  lung]
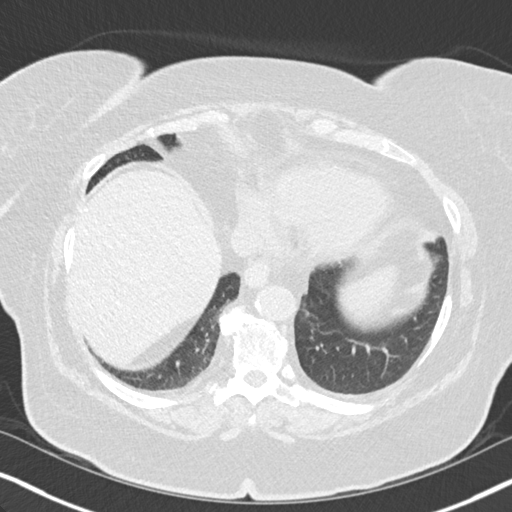
[im 51/143  mediastinal]
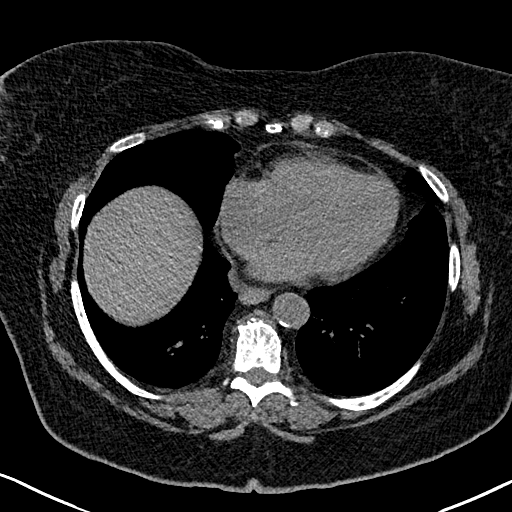
[im 51/143  lung]
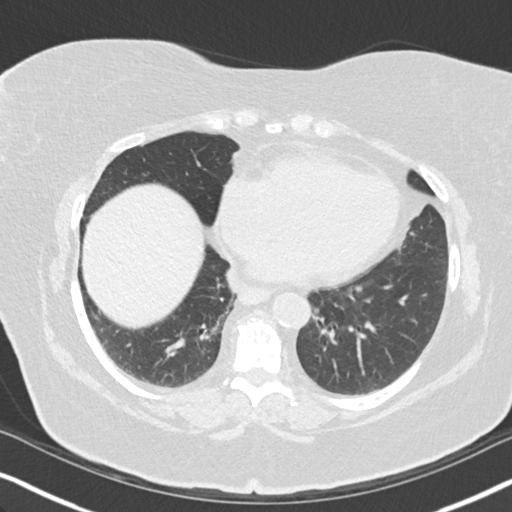
[im 61/143  lung]
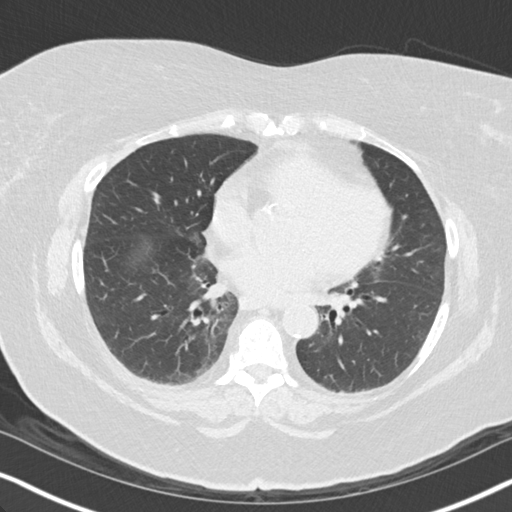
[im 82/143  lung]
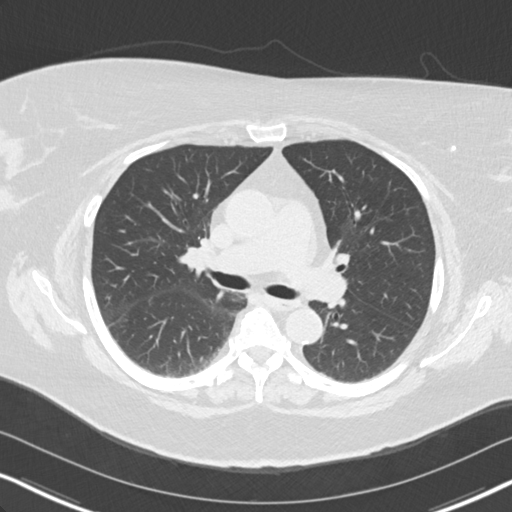
[im 92/143  lung]
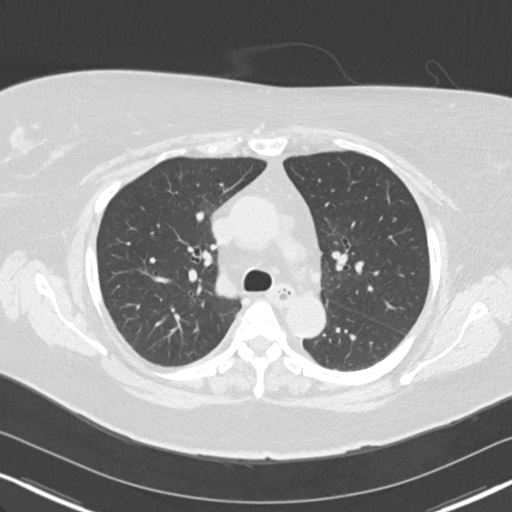
[im 102/143  mediastinal]
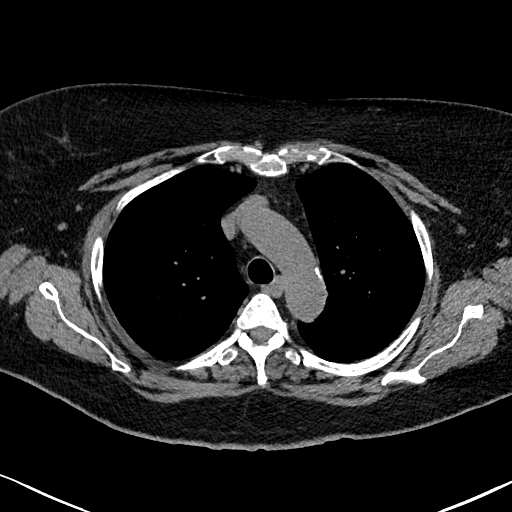
[im 102/143  lung]
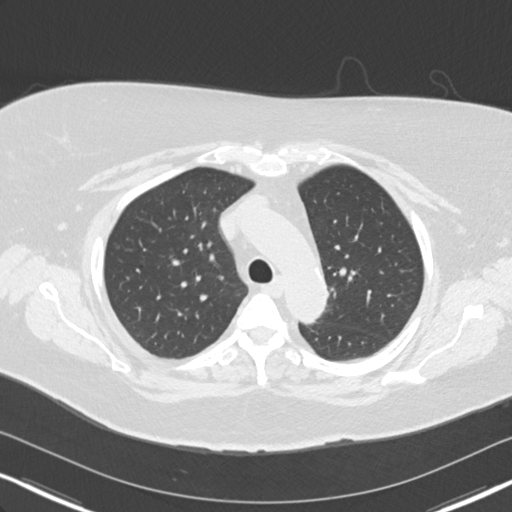
[im 112/143  lung]
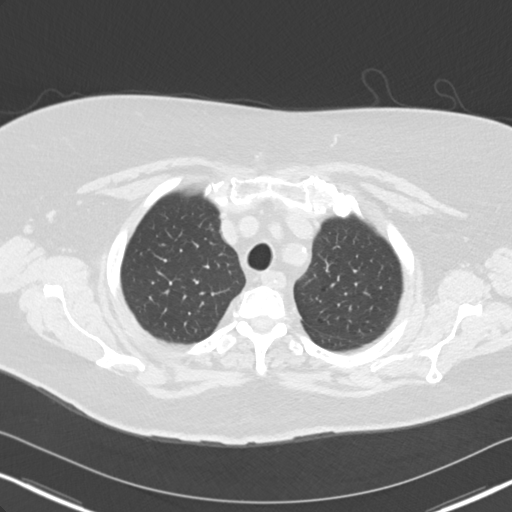
[im 122/143  lung]
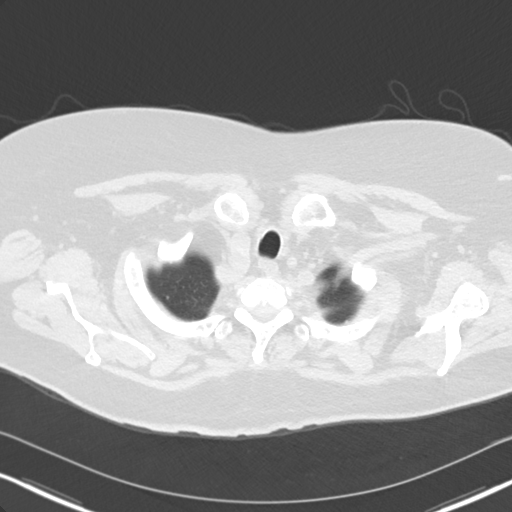
[im 132/143  lung]
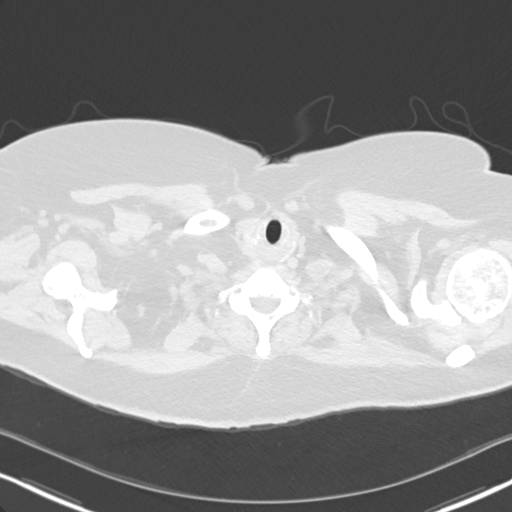

[Series 5: coronal · coronal · 0.59mm/px · 3 of 145 slices shown]
[im 29/145  lung]
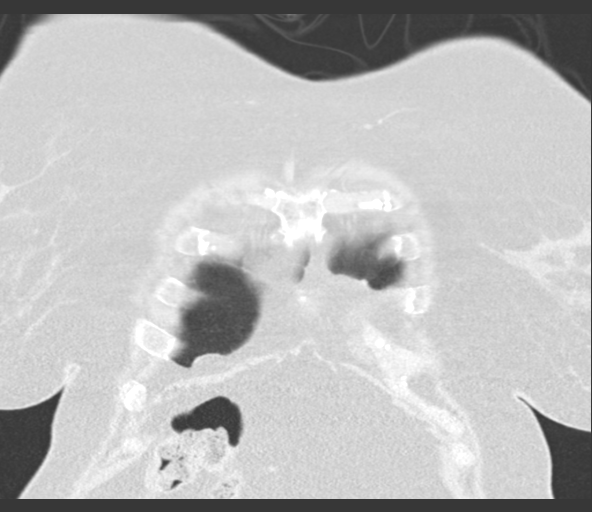
[im 58/145  lung]
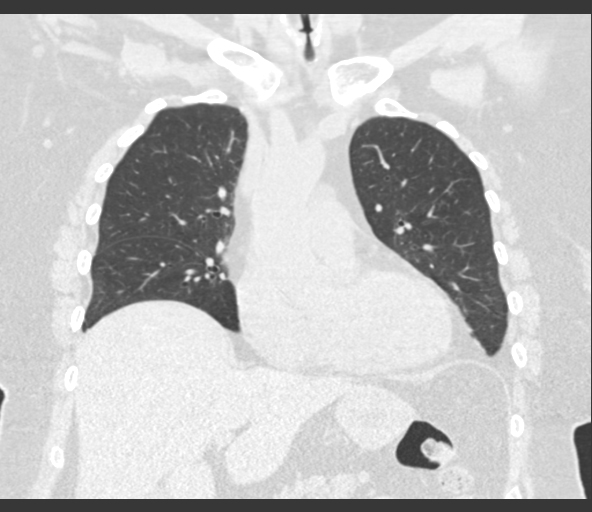
[im 87/145  lung]
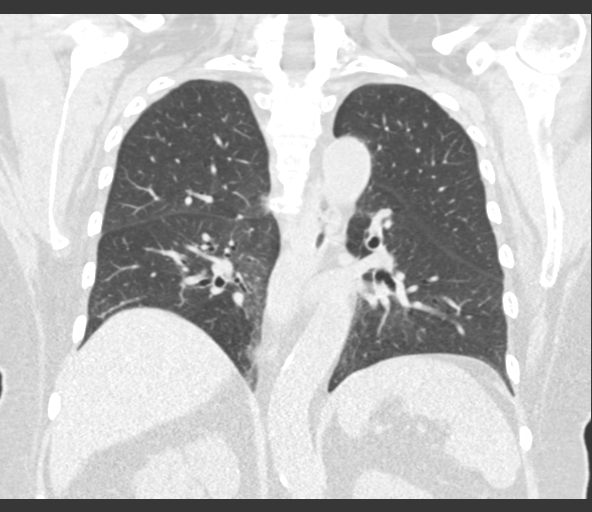

[15 of 36 positions shown; findings below may reference images not displayed]

FINDINGS: Cardiovascular: Heart size is normal. There is no significant
pericardial fluid, thickening or pericardial calcification. There is
aortic atherosclerosis, as well as atherosclerosis of the great
vessels of the mediastinum and the coronary arteries, including
calcified atherosclerotic plaque in the left anterior descending
coronary artery.

Mediastinum/Nodes: No pathologically enlarged mediastinal or hilar
lymph nodes. Please note that accurate exclusion of hilar adenopathy
is limited on noncontrast CT scans. Small hiatal hernia. No axillary
lymphadenopathy.

Lungs/Pleura: Multiple small 3-4 mm pulmonary nodules in the lungs
bilaterally, nonspecific. No other larger more suspicious appearing
pulmonary nodules or masses are noted. No acute consolidative
airspace disease. No pleural effusions.

Upper Abdomen: Aortic atherosclerosis.

Musculoskeletal: There are no aggressive appearing lytic or blastic
lesions noted in the visualized portions of the skeleton.
IMPRESSION: 1. Multiple small pulmonary nodules measuring 3-4 mm in size,
nonspecific, but favored to be benign. No follow-up needed if
patient is low-risk (and has no known or suspected primary
neoplasm). Non-contrast chest CT can be considered in 12 months if
patient is high-risk. This recommendation follows the consensus
statement: Guidelines for Management of Incidental Pulmonary Nodules
Detected on CT Images: From the [HOSPITAL] 3843; Radiology
3843; [DATE].
2. Aortic atherosclerosis, in addition to left anterior descending
coronary artery disease. Please note that although the presence of
coronary artery calcium documents the presence of coronary artery
disease, the severity of this disease and any potential stenosis
cannot be assessed on this non-gated CT examination. Assessment for
potential risk factor modification, dietary therapy or pharmacologic
therapy may be warranted, if clinically indicated.
3. Small hiatal hernia.

Aortic Atherosclerosis (TA248-DGC.C).

## 2021-02-08 DIAGNOSIS — J309 Allergic rhinitis, unspecified: Secondary | ICD-10-CM | POA: Diagnosis not present

## 2021-02-08 DIAGNOSIS — I10 Essential (primary) hypertension: Secondary | ICD-10-CM | POA: Diagnosis not present

## 2021-02-08 DIAGNOSIS — E1169 Type 2 diabetes mellitus with other specified complication: Secondary | ICD-10-CM | POA: Diagnosis not present

## 2021-02-10 DIAGNOSIS — Z794 Long term (current) use of insulin: Secondary | ICD-10-CM | POA: Diagnosis not present

## 2021-02-10 DIAGNOSIS — E119 Type 2 diabetes mellitus without complications: Secondary | ICD-10-CM | POA: Diagnosis not present

## 2021-02-10 DIAGNOSIS — Z961 Presence of intraocular lens: Secondary | ICD-10-CM | POA: Diagnosis not present

## 2021-02-10 DIAGNOSIS — Z7984 Long term (current) use of oral hypoglycemic drugs: Secondary | ICD-10-CM | POA: Diagnosis not present

## 2021-02-10 DIAGNOSIS — H524 Presbyopia: Secondary | ICD-10-CM | POA: Diagnosis not present

## 2021-02-10 DIAGNOSIS — Z79899 Other long term (current) drug therapy: Secondary | ICD-10-CM | POA: Diagnosis not present

## 2021-02-16 ENCOUNTER — Other Ambulatory Visit: Payer: Self-pay

## 2021-02-16 ENCOUNTER — Encounter (INDEPENDENT_AMBULATORY_CARE_PROVIDER_SITE_OTHER): Payer: Self-pay | Admitting: Bariatrics

## 2021-02-16 ENCOUNTER — Ambulatory Visit (INDEPENDENT_AMBULATORY_CARE_PROVIDER_SITE_OTHER): Payer: Medicare HMO | Admitting: Bariatrics

## 2021-02-16 VITALS — BP 125/79 | HR 88 | Temp 97.6°F | Ht 64.0 in | Wt 205.0 lb

## 2021-02-16 DIAGNOSIS — R69 Illness, unspecified: Secondary | ICD-10-CM | POA: Diagnosis not present

## 2021-02-16 DIAGNOSIS — E1169 Type 2 diabetes mellitus with other specified complication: Secondary | ICD-10-CM | POA: Diagnosis not present

## 2021-02-16 DIAGNOSIS — Z6841 Body Mass Index (BMI) 40.0 and over, adult: Secondary | ICD-10-CM

## 2021-02-16 DIAGNOSIS — F5089 Other specified eating disorder: Secondary | ICD-10-CM

## 2021-02-16 DIAGNOSIS — E669 Obesity, unspecified: Secondary | ICD-10-CM | POA: Diagnosis not present

## 2021-02-16 MED ORDER — BUPROPION HCL ER (SR) 200 MG PO TB12: 200.0000 mg | ORAL_TABLET | Freq: Every day | ORAL | 0 refills | Status: DC

## 2021-02-21 ENCOUNTER — Encounter (INDEPENDENT_AMBULATORY_CARE_PROVIDER_SITE_OTHER): Payer: Self-pay | Admitting: Bariatrics

## 2021-02-21 NOTE — Progress Notes (Signed)
Chief Complaint:   OBESITY Cassandra Palmer is here to discuss her progress with her obesity treatment plan along with follow-up of her obesity related diagnoses. Jeraldean is on keeping a food journal and adhering to recommended goals of 1200 calories and 30-40 protein and states she is following her eating plan approximately 30% of the time. Patrese states she is swimming 60 minutes 4-5 times per week.  Today's visit was #: 13 Starting weight: 239 lbs Starting date: 09/16/2020 Today's weight: 205 lbs Today's date: 02/16/2021 Total lbs lost to date: 34 lbs Total lbs lost since last in-office visit: 0  Interim History: Dessa is up 1 lb since her last visit. She is doing well with her eater and protein.   Subjective:   1. Type 2 diabetes mellitus with obesity ([place]) Camyla is taking Victoza, Metformin, and [place]. Her A1c is 6.6. She reports rare low blood sugars.  2. Other disorder of eating Jamilya is taking Wellbutrin 200 mg daily.  Assessment/Plan:   1. Type 2 diabetes mellitus with obesity ([facility]) Good blood sugar control is important to decrease the likelihood of diabetic complications such as nephropathy, neuropathy, limb loss, blindness, coronary artery disease, and death. Intensive lifestyle modification including diet, exercise and weight loss are the first line of treatment for diabetes. Continue current treatment plan. Increase water intake.  2. Other disorder of eating Behavior modification techniques were discussed today to help Laurelle deal with her emotional/non-hunger eating behaviors.  Orders and follow up as documented in patient record.   - buPROPion (WELLBUTRIN SR) 200 MG 12 hr tablet; Take 1 tablet (200 mg total) by mouth daily.  Dispense: 90 tablet; Refill: 0  3. Obesity, current BMI 35 Ellory is currently in the action stage of change. As such, her goal is to continue with weight loss efforts. She has agreed to keeping a food journal and adhering to recommended  goals of 1200 calories and 30-40 grams protein. Will add in protein shake.  1. Meal plan 2. Intentional eating 3. Increase protein  Exercise goals: As is  Behavioral modification strategies: increasing lean protein intake, decreasing simple carbohydrates, increasing vegetables, increasing water intake, decreasing eating out, no skipping meals, meal planning and cooking strategies, keeping healthy foods in the home and planning for success.  Tariyah has agreed to follow-up with our clinic in the first of June weeks. She was informed of the importance of frequent follow-up visits to maximize her success with intensive lifestyle modifications for her multiple health conditions.   Objective:   Blood pressure 125/79, pulse 88, temperature 97.6 F (36.4 C), height 5\' 4"  (1.626 m), weight 205 lb (93 kg), SpO2 95 %. Body mass index is 35.19 kg/m.  General: Cooperative, alert, well developed, in no acute distress. HEENT: Conjunctivae and lids unremarkable. Cardiovascular: Regular rhythm.  Lungs: Normal work of breathing. Neurologic: No focal deficits.   Lab Results  Component Value Date   CREATININE 0.88 05/11/2020   BUN 16 05/11/2020   NA 139 05/11/2020   K 4.4 05/11/2020   CL 99 05/11/2020   CO2 22 05/11/2020   Lab Results  Component Value Date   ALT 16 05/11/2020   AST 21 05/11/2020   ALKPHOS 103 05/11/2020   BILITOT 0.2 05/11/2020   Lab Results  Component Value Date   HGBA1C 7.1 (H) 05/11/2020   Lab Results  Component Value Date   INSULIN 4.3 05/11/2020   Lab Results  Component Value Date   TSH 5.110 (H) 05/11/2020  Lab Results  Component Value Date   CHOL 147 05/11/2020   HDL 47 05/11/2020   LDLCALC 76 05/11/2020   TRIG 140 05/11/2020   Lab Results  Component Value Date   WBC 14.0 (H) 07/15/2019   HGB 13.1 07/15/2019   HCT 41.9 05/11/2020   MCV 83.4 07/15/2019   PLT 284 07/15/2019   Lab Results  Component Value Date   IRON 40 05/11/2020   TIBC 422  05/11/2020   FERRITIN 14 (L) 05/11/2020    Obesity Behavioral Intervention:   Approximately 15 minutes were spent on the discussion below.  ASK: We discussed the diagnosis of obesity with Pamala Hurry today and Janeya agreed to give [place] permission to discuss obesity behavioral modification therapy today.  ASSESS: Jaedah has the diagnosis of obesity and her BMI today is 35.3. Zuly is in the action stage of change.   ADVISE: Payzlee was educated on the multiple health risks of obesity as well as the benefit of weight loss to improve her health. She was advised of the need for long term treatment and the importance of lifestyle modifications to improve her current health and to decrease her risk of future health problems.  AGREE: Multiple dietary modification options and treatment options were discussed and Azalee agreed to follow the recommendations documented in the above note.  ARRANGE: Brittay was educated on the importance of frequent visits to treat obesity as outlined per CMS and USPSTF guidelines and agreed to schedule her next follow up appointment today.  Attestation Statements:   Reviewed by clinician on day of visit: allergies, medications, problem list, medical history, surgical history, family history, social history, and previous encounter notes.  Coral Ceo, am acting as Location manager for [employer], DO.  I have reviewed the above documentation for accuracy and completeness, and I agree with the above. Jearld Lesch, DO

## 2021-03-02 DIAGNOSIS — Z0100 Encounter for examination of eyes and vision without abnormal findings: Secondary | ICD-10-CM | POA: Diagnosis not present

## 2021-03-02 DIAGNOSIS — Z961 Presence of intraocular lens: Secondary | ICD-10-CM | POA: Diagnosis not present

## 2021-03-02 DIAGNOSIS — H524 Presbyopia: Secondary | ICD-10-CM | POA: Diagnosis not present

## 2021-03-30 ENCOUNTER — Ambulatory Visit (INDEPENDENT_AMBULATORY_CARE_PROVIDER_SITE_OTHER): Payer: Medicare HMO | Admitting: Bariatrics

## 2021-05-09 ENCOUNTER — Other Ambulatory Visit: Payer: Self-pay

## 2021-05-09 ENCOUNTER — Encounter (INDEPENDENT_AMBULATORY_CARE_PROVIDER_SITE_OTHER): Payer: Self-pay | Admitting: Bariatrics

## 2021-05-09 ENCOUNTER — Ambulatory Visit (INDEPENDENT_AMBULATORY_CARE_PROVIDER_SITE_OTHER): Payer: Medicare HMO | Admitting: Bariatrics

## 2021-05-09 VITALS — BP 109/70 | HR 80 | Temp 97.7°F | Ht 64.0 in | Wt 208.0 lb

## 2021-05-09 DIAGNOSIS — E782 Mixed hyperlipidemia: Secondary | ICD-10-CM | POA: Diagnosis not present

## 2021-05-09 DIAGNOSIS — Z6841 Body Mass Index (BMI) 40.0 and over, adult: Secondary | ICD-10-CM | POA: Diagnosis not present

## 2021-05-09 DIAGNOSIS — E669 Obesity, unspecified: Secondary | ICD-10-CM

## 2021-05-09 DIAGNOSIS — E1169 Type 2 diabetes mellitus with other specified complication: Secondary | ICD-10-CM | POA: Diagnosis not present

## 2021-05-10 NOTE — Progress Notes (Signed)
Chief Complaint:   OBESITY Cassandra Palmer is here to discuss her progress with her obesity treatment plan along with follow-up of her obesity related diagnoses. Cassandra Palmer is on the Category 2 Plan and states she is following her eating plan approximately 35% of the time. Cassandra Palmer states she is swimming 60 minutes 5-6 times per week.  Today's visit was #: 14 Starting weight: 239 lbs Starting date: 09/16/2020 Today's weight: 208 lbs Today's date: 05/09/2021 Total lbs lost to date: 31 lbs Total lbs lost since last in-office visit: 0  Interim History: Cassandra Palmer is up 3 lbs since her last visit.  She is tired of some of the foods (eggs, tuna, etc). She is craving other foods.  Subjective:   1. Type 2 diabetes mellitus with obesity ([place]) Cassandra Palmer is taking Victoza, Glucophage and Cassandra Palmer. She denies any side effects.  2. Mixed hyperlipidemia Cassandra Palmer is taking Vytorin.  Assessment/Plan:   1. Type 2 diabetes mellitus with obesity ([facility]) Good blood sugar control is important to decrease the likelihood of diabetic complications such as nephropathy, neuropathy, limb loss, blindness, coronary artery disease, and death. Cassandra Palmer will continue medications. She will continue with Victoza. She will continue Keto bread.Intensive lifestyle modification including diet, exercise and weight loss are the first line of treatment for diabetes.    2. Mixed hyperlipidemia Cardiovascular risk and specific lipid/LDL goals reviewed.  Cassandra Palmer will continue Vytorin. She will continue with no trans fats, minimize saturated fats and increase monounsaturated fats. We discussed several lifestyle modifications today. Cassandra Palmer will continue to work on diet, exercise and weight loss efforts. Orders and follow up as documented in patient record.   Counseling Intensive lifestyle modifications are the first line treatment for this issue. Dietary changes: Increase soluble fiber. Decrease simple carbohydrates. Exercise changes:  Moderate to vigorous-intensity aerobic activity 150 minutes per week if tolerated. Lipid-lowering medications: see documented in medical record.  3. obesity, current BMI 35.8 Cassandra Palmer is currently in the action stage of change. As such, her goal is to continue with weight loss efforts. She has agreed to the Category 2 Plan.   Cassandra Palmer will continue meal planning.  She will continue intentional eating. She will keep protein high and will use the "Lose It" app.  Exercise goals:  As is.  Behavioral modification strategies: increasing lean protein intake, decreasing simple carbohydrates, increasing vegetables, increasing water intake, decreasing eating out, no skipping meals, meal planning and cooking strategies, keeping healthy foods in the home, and planning for success.  Cassandra Palmer has agreed to follow-up with our clinic in 3 months. She was informed of the importance of frequent follow-up visits to maximize her success with intensive lifestyle modifications for her multiple health conditions.   Objective:   Blood pressure 109/70, pulse 80, temperature 97.7 F (36.5 C), height 5\' 4"  (1.626 m), weight 208 lb (94.3 kg), SpO2 98 %. Body mass index is 35.7 kg/m.  General: Cooperative, alert, well developed, in no acute distress. HEENT: Conjunctivae and lids unremarkable. Cardiovascular: Regular rhythm.  Lungs: Normal work of breathing. Neurologic: No focal deficits.   Lab Results  Component Value Date   CREATININE 0.88 05/11/2020   BUN 16 05/11/2020   NA 139 05/11/2020   K 4.4 05/11/2020   CL 99 05/11/2020   CO2 22 05/11/2020   Lab Results  Component Value Date   ALT 16 05/11/2020   AST 21 05/11/2020   ALKPHOS 103 05/11/2020   BILITOT 0.2 05/11/2020   Lab Results  Component Value Date   HGBA1C  7.1 (H) 05/11/2020   Lab Results  Component Value Date   INSULIN 4.3 05/11/2020   Lab Results  Component Value Date   TSH 5.110 (H) 05/11/2020   Lab Results  Component Value Date    CHOL 147 05/11/2020   HDL 47 05/11/2020   LDLCALC 76 05/11/2020   TRIG 140 05/11/2020   Lab Results  Component Value Date   VD25OH 64.3 07/06/2020   VD25OH 103.0 (H) 05/11/2020   Lab Results  Component Value Date   WBC 14.0 (H) 07/15/2019   HGB 13.1 07/15/2019   HCT 41.9 05/11/2020   MCV 83.4 07/15/2019   PLT 284 07/15/2019   Lab Results  Component Value Date   IRON 40 05/11/2020   TIBC 422 05/11/2020   FERRITIN 14 (L) 05/11/2020    Obesity Behavioral Intervention:   Approximately 15 minutes were spent on the discussion below.  ASK: We discussed the diagnosis of obesity with Cassandra Palmer today and Cassandra Palmer agreed to give [place] permission to discuss obesity behavioral modification therapy today.  ASSESS: Cassandra Palmer has the diagnosis of obesity and her BMI today is 35.8. Cassandra Palmer is in the action stage of change.   ADVISE: Cassandra Palmer was educated on the multiple health risks of obesity as well as the benefit of weight loss to improve her health. She was advised of the need for long term treatment and the importance of lifestyle modifications to improve her current health and to decrease her risk of future health problems.  AGREE: Multiple dietary modification options and treatment options were discussed and Cassandra Palmer agreed to follow the recommendations documented in the above note.  ARRANGE: Cassandra Palmer was educated on the importance of frequent visits to treat obesity as outlined per CMS and USPSTF guidelines and agreed to schedule her next follow up appointment today.  Attestation Statements:   Reviewed by clinician on day of visit: allergies, medications, problem list, medical history, surgical history, family history, social history, and previous encounter notes.  I, Lizbeth Bark, RMA, am acting as Location manager for [employer], DO.  I have reviewed the above documentation for accuracy and completeness, and I agree with the above. Jearld Lesch, DO

## 2021-05-11 ENCOUNTER — Encounter (INDEPENDENT_AMBULATORY_CARE_PROVIDER_SITE_OTHER): Payer: Self-pay | Admitting: Bariatrics

## 2021-08-09 ENCOUNTER — Ambulatory Visit (INDEPENDENT_AMBULATORY_CARE_PROVIDER_SITE_OTHER): Payer: Medicare HMO | Admitting: Bariatrics

## 2021-08-19 DIAGNOSIS — I10 Essential (primary) hypertension: Secondary | ICD-10-CM | POA: Diagnosis not present

## 2021-08-19 DIAGNOSIS — E1169 Type 2 diabetes mellitus with other specified complication: Secondary | ICD-10-CM | POA: Diagnosis not present

## 2021-08-19 DIAGNOSIS — E039 Hypothyroidism, unspecified: Secondary | ICD-10-CM | POA: Diagnosis not present

## 2021-08-19 DIAGNOSIS — E559 Vitamin D deficiency, unspecified: Secondary | ICD-10-CM | POA: Diagnosis not present

## 2021-08-19 DIAGNOSIS — E782 Mixed hyperlipidemia: Secondary | ICD-10-CM | POA: Diagnosis not present

## 2021-08-19 DIAGNOSIS — C9110 Chronic lymphocytic leukemia of B-cell type not having achieved remission: Secondary | ICD-10-CM | POA: Diagnosis not present

## 2021-08-23 ENCOUNTER — Encounter (INDEPENDENT_AMBULATORY_CARE_PROVIDER_SITE_OTHER): Payer: Self-pay | Admitting: Bariatrics

## 2021-08-23 ENCOUNTER — Ambulatory Visit (INDEPENDENT_AMBULATORY_CARE_PROVIDER_SITE_OTHER): Payer: Medicare HMO | Admitting: Bariatrics

## 2021-08-23 ENCOUNTER — Other Ambulatory Visit: Payer: Self-pay

## 2021-08-23 ENCOUNTER — Other Ambulatory Visit (INDEPENDENT_AMBULATORY_CARE_PROVIDER_SITE_OTHER): Payer: Self-pay | Admitting: Bariatrics

## 2021-08-23 VITALS — BP 145/81 | HR 85 | Temp 97.4°F | Ht 64.0 in | Wt 205.0 lb

## 2021-08-23 DIAGNOSIS — E669 Obesity, unspecified: Secondary | ICD-10-CM

## 2021-08-23 DIAGNOSIS — Z6835 Body mass index (BMI) 35.0-35.9, adult: Secondary | ICD-10-CM

## 2021-08-23 DIAGNOSIS — E1169 Type 2 diabetes mellitus with other specified complication: Secondary | ICD-10-CM | POA: Diagnosis not present

## 2021-08-23 DIAGNOSIS — I10 Essential (primary) hypertension: Secondary | ICD-10-CM | POA: Diagnosis not present

## 2021-08-23 DIAGNOSIS — F5089 Other specified eating disorder: Secondary | ICD-10-CM

## 2021-08-23 DIAGNOSIS — R69 Illness, unspecified: Secondary | ICD-10-CM | POA: Diagnosis not present

## 2021-08-23 MED ORDER — TROKENDI XR 50 MG PO CP24: 50.0000 mg | ORAL_CAPSULE | Freq: Every day | ORAL | 0 refills | Status: DC

## 2021-08-23 MED ORDER — BUPROPION HCL ER (SR) 200 MG PO TB12: 200.0000 mg | ORAL_TABLET | Freq: Every day | ORAL | 0 refills | Status: DC

## 2021-08-23 NOTE — Progress Notes (Signed)
Chief Complaint:   OBESITY Cassandra Palmer is here to discuss her progress with her obesity treatment plan along with follow-up of her obesity related diagnoses. Cassandra Palmer is on the Category 2 Plan and states she is following her eating plan approximately 50% of the time. Cassandra Palmer states she is doing water aerobics for 60 minutes 4-5 times per week.  Today's visit was #: 15 Starting weight: 239 lbs Starting date: 09/16/2020 Today's weight: 205 lbs Today's date: 08/23/2021 Total lbs lost to date: 34 Total lbs lost since last in-office visit: 3  Interim History: Cassandra Palmer is down another 3 lbs. She was last seen on 05/09/2021 by me. She is doing well with her water and protein intake.  Subjective:   1. Essential hypertension Cassandra Palmer is taking Coreg, and her blood pressure is reasonably well controlled.  2. Type 2 diabetes mellitus with obesity ([place]) Cassandra Palmer is taking [place] and metformin. Her last A1c was 6.4.  3. Other disorder of eating Cassandra Palmer notes cravings, but they are better.  Assessment/Plan:   1. Essential hypertension Cassandra Palmer will continue her medications, healthy weight loss and exercise to improve blood pressure control. She will watch for signs of hypotension as she continues her lifestyle modifications.  2. Type 2 diabetes mellitus with obesity ([place]) Cassandra Palmer will continue her medications. Good blood sugar control is important to decrease the likelihood of diabetic complications such as nephropathy, neuropathy, limb loss, blindness, coronary artery disease, and death. Intensive lifestyle modification including diet, exercise and weight loss are the first line of treatment for diabetes.   3. Other disorder of eating Behavior modification techniques were discussed today to help Cassandra Palmer deal with her emotional/non-hunger eating behaviors. Cassandra Palmer agreed to start topiramate 50 mg q daily with no refills, and we will refill Wellbutrin SR for 90 days with no refills. Orders and  follow up as documented in patient record.   - Topiramate ER (TROKENDI XR) 50 MG CP24; Take 50 mg by mouth daily.  Dispense: 30 capsule; Refill: 0 - buPROPion (WELLBUTRIN SR) 200 MG 12 hr tablet; Take 1 tablet (200 mg total) by mouth daily.  Dispense: 90 tablet; Refill: 0  4. Obesity with current BMI of 35.3 Cassandra Palmer is currently in the action stage of change. As such, her goal is to continue with weight loss efforts. She has agreed to the Category 2 Plan.   Cassandra Palmer will adhere closely to the meal plan at least 80-90 %. Recipes were given. I reviewed labs from 08/19/2021 with the patient today (CMP, Lipids, A1c, TSH, and CBC).  Exercise goals: As is.  Behavioral modification strategies: increasing lean protein intake, decreasing simple carbohydrates, increasing vegetables, increasing water intake, decreasing eating out, no skipping meals, meal planning and cooking strategies, keeping healthy foods in the home, and planning for success.  Cassandra Palmer has agreed to follow-up with our clinic in 4 months. She was informed of the importance of frequent follow-up visits to maximize her success with intensive lifestyle modifications for her multiple health conditions.   Objective:   Blood pressure (!) 145/81, pulse 85, temperature (!) 97.4 F (36.3 C), height 5\' 4"  (1.626 m), weight 205 lb (93 kg), SpO2 97 %. Body mass index is 35.19 kg/m.  General: Cooperative, alert, well developed, in no acute distress. HEENT: Conjunctivae and lids unremarkable. Cardiovascular: Regular rhythm.  Lungs: Normal work of breathing. Neurologic: No focal deficits.   Lab Results  Component Value Date   CREATININE 0.88 05/11/2020   BUN 16 05/11/2020   NA 139 05/11/2020  K 4.4 05/11/2020   CL 99 05/11/2020   CO2 22 05/11/2020   Lab Results  Component Value Date   ALT 16 05/11/2020   AST 21 05/11/2020   ALKPHOS 103 05/11/2020   BILITOT 0.2 05/11/2020   Lab Results  Component Value Date   HGBA1C 7.1 (H)  05/11/2020   Lab Results  Component Value Date   INSULIN 4.3 05/11/2020   Lab Results  Component Value Date   TSH 5.110 (H) 05/11/2020   Lab Results  Component Value Date   CHOL 147 05/11/2020   HDL 47 05/11/2020   LDLCALC 76 05/11/2020   TRIG 140 05/11/2020   Lab Results  Component Value Date   VD25OH 64.3 07/06/2020   VD25OH 103.0 (H) 05/11/2020   Lab Results  Component Value Date   WBC 14.0 (H) 07/15/2019   HGB 13.1 07/15/2019   HCT 41.9 05/11/2020   MCV 83.4 07/15/2019   PLT 284 07/15/2019   Lab Results  Component Value Date   IRON 40 05/11/2020   TIBC 422 05/11/2020   FERRITIN 14 (L) 05/11/2020   Attestation Statements:   Reviewed by clinician on day of visit: allergies, medications, problem list, medical history, surgical history, family history, social history, and previous encounter notes.   Wilhemena Durie, am acting as Location manager for [employer], DO.  I have reviewed the above documentation for accuracy and completeness, and I agree with the above. Jearld Lesch, DO

## 2021-08-25 ENCOUNTER — Encounter (INDEPENDENT_AMBULATORY_CARE_PROVIDER_SITE_OTHER): Payer: Self-pay | Admitting: Bariatrics

## 2021-08-25 ENCOUNTER — Telehealth (INDEPENDENT_AMBULATORY_CARE_PROVIDER_SITE_OTHER): Payer: Self-pay

## 2021-08-25 ENCOUNTER — Other Ambulatory Visit (INDEPENDENT_AMBULATORY_CARE_PROVIDER_SITE_OTHER): Payer: Self-pay | Admitting: Bariatrics

## 2021-08-25 MED ORDER — TOPIRAMATE 25 MG PO TABS: 25.0000 mg | ORAL_TABLET | Freq: Two times a day (BID) | ORAL | 0 refills | Status: DC

## 2021-08-25 NOTE — Telephone Encounter (Signed)
Pt called in and stated that her insurance didn't accept the [place] and she is requesting a prescription on the other Medicaine that Dr. Owens Shark suggested. Sent to [facility] on [address]. Please advise

## 2021-08-25 NOTE — Telephone Encounter (Signed)
Please review

## 2021-09-15 DIAGNOSIS — R7989 Other specified abnormal findings of blood chemistry: Secondary | ICD-10-CM | POA: Diagnosis not present

## 2021-09-15 DIAGNOSIS — E1169 Type 2 diabetes mellitus with other specified complication: Secondary | ICD-10-CM | POA: Diagnosis not present

## 2021-09-15 DIAGNOSIS — I10 Essential (primary) hypertension: Secondary | ICD-10-CM | POA: Diagnosis not present

## 2021-09-15 DIAGNOSIS — E782 Mixed hyperlipidemia: Secondary | ICD-10-CM | POA: Diagnosis not present

## 2021-09-15 DIAGNOSIS — E559 Vitamin D deficiency, unspecified: Secondary | ICD-10-CM | POA: Diagnosis not present

## 2021-09-15 DIAGNOSIS — M5430 Sciatica, unspecified side: Secondary | ICD-10-CM | POA: Diagnosis not present

## 2021-09-15 DIAGNOSIS — E039 Hypothyroidism, unspecified: Secondary | ICD-10-CM | POA: Diagnosis not present

## 2021-09-15 DIAGNOSIS — Z7984 Long term (current) use of oral hypoglycemic drugs: Secondary | ICD-10-CM | POA: Diagnosis not present

## 2021-09-16 ENCOUNTER — Encounter (INDEPENDENT_AMBULATORY_CARE_PROVIDER_SITE_OTHER): Payer: Self-pay | Admitting: Bariatrics

## 2021-09-17 ENCOUNTER — Other Ambulatory Visit (INDEPENDENT_AMBULATORY_CARE_PROVIDER_SITE_OTHER): Payer: Self-pay | Admitting: Bariatrics

## 2021-09-19 ENCOUNTER — Other Ambulatory Visit (INDEPENDENT_AMBULATORY_CARE_PROVIDER_SITE_OTHER): Payer: Self-pay | Admitting: Bariatrics

## 2021-09-19 ENCOUNTER — Telehealth: Payer: Self-pay | Admitting: Hematology

## 2021-09-19 NOTE — Telephone Encounter (Signed)
LAST APPOINTMENT DATE: 08/23/21 NEXT APPOINTMENT DATE: 11/29/21   [id number] - [place], [place] - [address] [place] [address] Phone: [phone] Fax: [phone]  Patient is requesting a refill of the following medications: Pending Prescriptions:                       Disp   Refills   topiramate (TOPAMAX) 25 MG tablet [Pharmac*60 tab*0       Sig: TAKE 1 TABLET BY MOUTH TWICE A DAY   Date last filled: 08/25/21 Previously prescribed by Dr. Owens Shark  Lab Results      Component                Value               Date                      HGBA1C                   7.1 (H)             05/11/2020           Lab Results      Component                Value               Date                      LDLCALC                  76                  05/11/2020                CREATININE               0.88                05/11/2020           Lab Results      Component                Value               Date                      VD25OH                   64.3                07/06/2020                VD25OH                   103.0 (H)           05/11/2020            BP Readings from Last 3 Encounters: 08/23/21 : (!) 145/81 05/09/21 : 109/70 02/16/21 : 125/79

## 2021-09-19 NOTE — Telephone Encounter (Signed)
Scheduled appt per 11/18 referral. Pt is aware of appt date and time.

## 2021-09-19 NOTE — Telephone Encounter (Signed)
Pt last seen by Dr. Brown.  

## 2021-09-19 NOTE — Telephone Encounter (Signed)
Please review

## 2021-09-27 DIAGNOSIS — Z0000 Encounter for general adult medical examination without abnormal findings: Secondary | ICD-10-CM | POA: Diagnosis not present

## 2021-09-30 ENCOUNTER — Other Ambulatory Visit: Payer: Self-pay

## 2021-09-30 ENCOUNTER — Inpatient Hospital Stay: Payer: Medicare HMO | Attending: Hematology | Admitting: Hematology

## 2021-09-30 ENCOUNTER — Inpatient Hospital Stay: Payer: Medicare HMO

## 2021-09-30 VITALS — BP 111/70 | HR 81 | Temp 98.2°F | Resp 17 | Ht 64.0 in | Wt 204.4 lb

## 2021-09-30 DIAGNOSIS — D72820 Lymphocytosis (symptomatic): Secondary | ICD-10-CM | POA: Diagnosis not present

## 2021-09-30 DIAGNOSIS — Z7984 Long term (current) use of oral hypoglycemic drugs: Secondary | ICD-10-CM | POA: Insufficient documentation

## 2021-09-30 DIAGNOSIS — R718 Other abnormality of red blood cells: Secondary | ICD-10-CM | POA: Insufficient documentation

## 2021-09-30 DIAGNOSIS — Z7982 Long term (current) use of aspirin: Secondary | ICD-10-CM | POA: Diagnosis not present

## 2021-09-30 DIAGNOSIS — D509 Iron deficiency anemia, unspecified: Secondary | ICD-10-CM

## 2021-09-30 DIAGNOSIS — E538 Deficiency of other specified B group vitamins: Secondary | ICD-10-CM | POA: Insufficient documentation

## 2021-09-30 LAB — CBC WITH DIFFERENTIAL/PLATELET
Abs Immature Granulocytes: 0.03 10*3/uL (ref 0.00–0.07)
Basophils Absolute: 0.1 10*3/uL (ref 0.0–0.1)
Basophils Relative: 0 %
Eosinophils Absolute: 0.4 10*3/uL (ref 0.0–0.5)
Eosinophils Relative: 3 %
HCT: 40.4 % (ref 36.0–46.0)
Hemoglobin: 12.5 g/dL (ref 12.0–15.0)
Immature Granulocytes: 0 %
Lymphocytes Relative: 35 %
Lymphs Abs: 4.8 10*3/uL — ABNORMAL HIGH (ref 0.7–4.0)
MCH: 24.6 pg — ABNORMAL LOW (ref 26.0–34.0)
MCHC: 30.9 g/dL (ref 30.0–36.0)
MCV: 79.5 fL — ABNORMAL LOW (ref 80.0–100.0)
Monocytes Absolute: 0.9 10*3/uL (ref 0.1–1.0)
Monocytes Relative: 6 %
Neutro Abs: 7.6 10*3/uL (ref 1.7–7.7)
Neutrophils Relative %: 56 %
Platelets: 271 10*3/uL (ref 150–400)
RBC: 5.08 MIL/uL (ref 3.87–5.11)
RDW: 16.5 % — ABNORMAL HIGH (ref 11.5–15.5)
WBC: 13.8 10*3/uL — ABNORMAL HIGH (ref 4.0–10.5)
nRBC: 0 % (ref 0.0–0.2)

## 2021-09-30 LAB — FERRITIN: Ferritin: 11 ng/mL (ref 11–307)

## 2021-09-30 LAB — CMP (CANCER CENTER ONLY)
ALT: 13 U/L (ref 0–44)
AST: 14 U/L — ABNORMAL LOW (ref 15–41)
Albumin: 4.5 g/dL (ref 3.5–5.0)
Alkaline Phosphatase: 63 U/L (ref 38–126)
Anion gap: 9 (ref 5–15)
BUN: 19 mg/dL (ref 8–23)
CO2: 23 mmol/L (ref 22–32)
Calcium: 9.7 mg/dL (ref 8.9–10.3)
Chloride: 107 mmol/L (ref 98–111)
Creatinine: 0.81 mg/dL (ref 0.44–1.00)
GFR, Estimated: >60 mL/min (ref >60)
Glucose, Bld: 113 mg/dL — ABNORMAL HIGH (ref 70–99)
Potassium: 4.3 mmol/L (ref 3.5–5.1)
Sodium: 139 mmol/L (ref 135–145)
Total Bilirubin: 0.4 mg/dL (ref 0.3–1.2)
Total Protein: 7 g/dL (ref 6.5–8.1)

## 2021-09-30 LAB — LACTATE DEHYDROGENASE: LDH: 136 U/L (ref 98–192)

## 2021-09-30 LAB — IRON AND TIBC
Iron: 33 ug/dL — ABNORMAL LOW (ref 41–142)
Saturation Ratios: 8 % — ABNORMAL LOW (ref 21–57)
TIBC: 430 ug/dL (ref 236–444)
UIBC: 397 ug/dL — ABNORMAL HIGH (ref 120–384)

## 2021-09-30 LAB — VITAMIN B12: Vitamin B-12: 292 pg/mL (ref 180–914)

## 2021-10-06 NOTE — Progress Notes (Addendum)
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 female who has been referred to [place] by Dr. Lawerance Cruel for evaluation and management of Leukocytosis. The pt reports that she is doing well overall. She has had elevated WBC's for the past several years. She notes that she has a hx of elevated leukocytes.  She notes that her blood sugar levels were mildly elevated due to a recent change in her medications due to her insurance not covering these medications. She has been on Metformin for the past 5 years. She has been on Victoza for 1.5 years. She has her labs checked regularly by her PCP. She denies any recent viral infections or flu like symptoms. She denies any recent illnesses within the past 5 years or recent abx use. She has never been on steroids or prednisone. She is UTD with her mammograms and colonoscopy (last one within the last 3 years).   The pt reports associated fatigue, joint stiffness every morning that alleviates with movement, recurrent night sweats at 4-5 AM (clammy sensation). She takes vitamin D and she has had an elevated pulse rate that was evaluated with an echocardiogram with relief of her fatigue. She denies joint pain, abdominal pain, weight loss, mouth sores, and any other symptoms. She denies any other new symptoms in the past 6 months or year. She denies hx of rheumatological symptoms, thyroid issues, blood transfusions, tattoos. She stopped taking Invokana approximately 1-1.5 years ago due to recurrent yeast infections.   Most recent lab results (07/03/18) of CBC w/diff and CMP is as follows: all values are WNL except for WBC at 11.3k, MCV at 80.8, MCH at 26.0, Lymphs abs at 4.1k, Monocytes abs at 900, Glucose at  143.  On PMHx the pt reports Vitamin D deficiency, Essential HTN, hyperlipidemia associated with DM Type 2, anemia, insomnia, Rosacea, seasonal allergies, and sciatic back pain radiating to right leg. On Social Hx the pt reports she used to smoke cigarettes in her 20's however, she hasn't smoked for the last 50 years. On Family Hx the pt reports Thyroid issues involving her mother and sister. Her mother had a hx of CLL. She notes rheumatic fever on her father's side.   Prior to retiring, she was a Tree surgeon for a [employer]   Interval History:   Cassandra Palmer is here for follow-up to reestablish follow-up for her CLL.  She was last seen by [place] in clinic on 07/17/2019 and was then lost to follow-up. 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  As a result this was classified as likely CLL.  Patient was referred back to [place] by her primary care physician for continued follow-up of her CLL. She had labs done today which show CBC with a hemoglobin of 12.5, WBC count of 13.8k with lymphocytes of 4.8k and normal platelets of 271k CMP within normal limits LDH within normal limits at 136. Ferritin is 11 with an iron saturation of 8% B12 is somewhat low at 292  Patient has been on daily aspirin 325 mg and is also been taking  Celebrex for her arthritis quite regularly.  We discussed that this combination can increase the risk of GI bleeding and ulcers. She is also on chronic metformin which can cause B12 deficiency.  Patient notes no abdominal pain, overt GI bleeding. She notes no fevers, chills, night sweats, new lumps or bumps, unexpected weight loss.   MEDICAL HISTORY:  Past Medical History:  Diagnosis Date   Anemia    Back pain    CLL (chronic lymphocytic  leukemia) (HCC)    Diabetes mellitus type 2, uncontrolled    Edema of both lower extremities    Hyperlipidemia    Hypertension    Hypothyroidism    Joint pain    Lumbar spondylolysis    Obesity    Sciatic nerve disease    SOB (shortness of breath)    Vitamin D deficiency     SURGICAL HISTORY: Past Surgical History:  Procedure Laterality Date   ELBOW SURGERY     KNEE SURGERY     TONSILLECTOMY AND ADENOIDECTOMY      SOCIAL HISTORY: Social History   Socioeconomic History   Marital status: Single    Spouse name: Not on file   Number of children: 0   Years of education: college   Highest education level: Not on file  Occupational History   Occupation: Retired  Tobacco Use   Smoking status: Former   Smokeless tobacco: Never  Scientific laboratory technician Use: Never used  Substance and Sexual Activity   Alcohol use: Yes    Alcohol/week: 1.0 - 2.0 standard drink    Types: 1 - 2 Glasses of wine per week    Comment: Occasional   Drug use: No   Sexual activity: Not Currently  Other Topics Concern   Not on file  Social History Narrative   Not on file   Social Determinants of Health   Financial Resource Strain: Not on file  Food Insecurity: Not on file  Transportation Needs: Not on file  Physical Activity: Not on file  Stress: Not on file  Social Connections: Not on file  Intimate Partner Violence: Not on file    FAMILY HISTORY: Family History  Problem Relation Age of Onset   Congestive Heart Failure Mother    Hypertension Mother    Thyroid disease Mother    Cancer Mother    Anxiety disorder Mother    Diabetes Father    Heart Problems Father    Hyperlipidemia Father    Dementia Father    Thyroid disease Father    Cancer Father    Congestive Heart Failure Maternal Grandmother    Heart attack Maternal Grandfather    Heart disease Paternal Grandmother     ALLERGIES:  is allergic to invokana [canagliflozin], actos [pioglitazone], and tinactin foot-sneaker deodor  [podiatric products].  MEDICATIONS:  Current Outpatient Medications  Medication Sig Dispense Refill   aspirin 325 MG tablet Take 325 mg by mouth daily.     buPROPion (WELLBUTRIN SR) 200 MG 12 hr tablet Take 1 tablet (200 mg total) by mouth daily. 90 tablet 0   carvedilol (COREG) 3.125 MG tablet Take 1 tablet (3.125 mg total) by mouth 2 (two) times daily. 180 tablet 3   celecoxib (CELEBREX) 200 MG capsule Take 200 mg by mouth daily.     cetirizine (ZYRTEC) 10 MG tablet Take 10 mg by mouth daily.     cyclobenzaprine (FLEXERIL) 10 MG tablet Take 10 mg by mouth daily as needed for muscle spasms.  ezetimibe-simvastatin (VYTORIN) 10-40 MG tablet Take 1 tablet by mouth daily.     furosemide (LASIX) 40 MG tablet Take 40 mg by mouth.     glucose blood test strip 1 each by Other route as needed for other. Use as instructed     insulin degludec (TRESIBA) 100 UNIT/ML FlexTouch Pen Inject 30-40 Units into the skin daily. Takes 25-32 U     levothyroxine (SYNTHROID) 50 MCG tablet Take 100 mcg by mouth daily before breakfast. 100 mcg     metFORMIN (GLUCOPHAGE) 1000 MG tablet Take 1,000 mg by mouth 2 (two) times daily with a meal.     metroNIDAZOLE (METROCREAM) 0.75 % cream Apply 1 application topically as directed.     montelukast (SINGULAIR) 10 MG tablet Take 10 mg by mouth at bedtime.     niacin (NIASPAN) 1000 MG CR tablet Take 500 mg by mouth at bedtime. 500-100 mg     topiramate (TOPAMAX) 25 MG tablet TAKE 1 TABLET BY MOUTH TWICE A DAY 180 tablet 0   traZODone (DESYREL) 100 MG tablet Take 100 mg by mouth at bedtime as needed for sleep.     TRULICITY 4.5 RW/4.3XV SOPN Inject into the skin once a week.     No current facility-administered medications for this visit.    REVIEW OF SYSTEMS:    .10 Point review of Systems was done is negative except as noted above.   PHYSICAL EXAMINATION:  Vitals:   09/30/21 0919  BP: 111/70  Pulse: 81  Resp: 17  Temp: 98.2 F (36.8 C)  SpO2: 99%   Filed  Weights   09/30/21 0919  Weight: 204 lb 6.4 oz (92.7 kg)   .Body mass index is 35.09 kg/m.  Marland Kitchen GENERAL:alert, in no acute distress and comfortable SKIN: no acute rashes, no significant lesions EYES: conjunctiva are pink and non-injected, sclera anicteric OROPHARYNX: MMM, no exudates, no oropharyngeal erythema or ulceration NECK: supple, no JVD LYMPH:  no palpable lymphadenopathy in the cervical, axillary or inguinal regions LUNGS: clear to auscultation b/l with normal respiratory effort HEART: regular rate & rhythm ABDOMEN:  normoactive bowel sounds , non tender, not distended. Extremity: no pedal edema PSYCH: alert & oriented x 3 with fluent speech NEURO: no focal motor/sensory deficits   LABORATORY DATA:  I have reviewed the data as listed  . CBC Latest Ref Rng & Units 09/30/2021 05/11/2020 07/15/2019  WBC 4.0 - 10.5 K/uL 13.8(H) - 14.0(H)  Hemoglobin 12.0 - 15.0 g/dL 12.5 - 13.1  Hematocrit 36.0 - 46.0 % 40.4 41.9 41.8  Platelets 150 - 400 K/uL 271 - 284   . CBC    Component Value Date/Time   WBC 13.8 (H) 09/30/2021 1015   RBC 5.08 09/30/2021 1015   HGB 12.5 09/30/2021 1015   HCT 40.4 09/30/2021 1015   HCT 41.9 05/11/2020 1335   PLT 271 09/30/2021 1015   MCV 79.5 (L) 09/30/2021 1015   MCH 24.6 (L) 09/30/2021 1015   MCHC 30.9 09/30/2021 1015   RDW 16.5 (H) 09/30/2021 1015   LYMPHSABS 4.8 (H) 09/30/2021 1015   MONOABS 0.9 09/30/2021 1015   EOSABS 0.4 09/30/2021 1015   BASOSABS 0.1 09/30/2021 1015     . CMP Latest Ref Rng & Units 09/30/2021 05/11/2020 07/15/2019  Glucose 70 - 99 mg/dL 113(H) 115(H) 109(H)  BUN 8 - 23 mg/dL 19 16 11   Creatinine 0.44 - 1.00 mg/dL 0.81 0.88 0.82  Sodium 135 - 145 mmol/L 139 139 141  Potassium 3.5 - 5.1 mmol/L  4.3 4.4 4.1  Chloride 98 - 111 mmol/L 107 99 103  CO2 22 - 32 mmol/L 23 22 28   Calcium 8.9 - 10.3 mg/dL 9.7 9.6 9.7  Total Protein 6.5 - 8.1 g/dL 7.0 6.7 7.1  Total Bilirubin 0.3 - 1.2 mg/dL 0.4 0.2 0.4  Alkaline Phos 38 -  126 U/L 63 103 81  AST 15 - 41 U/L 14(L) 21 16  ALT 0 - 44 U/L 13 16 11    . Lab Results  Component Value Date   LDH 136 09/30/2021    . Lab Results  Component Value Date   IRON 33 (L) 09/30/2021   TIBC 430 09/30/2021   IRONPCTSAT 8 (L) 09/30/2021   (Iron and TIBC)  Lab Results  Component Value Date   FERRITIN 11 09/30/2021   B12 - 292  Component     Latest Ref Rng & Units 08/05/2018  Hep B Core Ab, Tot     Negative Negative  Hepatitis B Surface Ag     Negative Negative  HCV Ab     0.0 - 0.9 s/co ratio <0.1  LDH     98 - 192 U/L 175   09/09/18 CLL FISH:      07/03/18 CBC w/diff:    RADIOGRAPHIC STUDIES: I have personally reviewed the radiological images as listed and agreed with the findings in the report. No results found.   ASSESSMENT & PLAN:  75 y.o. female with  1.  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  08/05/18 Peripheral Blood Flow cytometry revealed CD5 positive monoclonal B-cell population at comprising 49% of total lymphocytes  08/05/18 Hep B and Hep C serologies neg   09/09/18 FISH CLL Prognostic panel revealed 17% of cells with mono-allelic 16X deletion, 09% of cells with bi-allelic 60A deletion, and 49% cells with normal nuclei.   09/18/18 PET/CT revealed Focal hypermetabolism in the proximal right femoral metaphysis without discrete bone lesion on the CT images, suspicious for osseous lymphoma. 2. No additional metabolic evidence of lymphoma. No hypermetabolic lymphadenopathy. Normal size non-hypermetabolic spleen. 3. Few scattered solid pulmonary nodules, largest 7 mm, below PET resolution. Recommend attention on follow-up chest CT in 3-6 months. 4. Chronic findings include: Aortic Atherosclerosis. Coronary atherosclerosis. Small hiatal hernia. Moderate colonic diverticulosis. Cholelithiasis. Myomatous uterus.   10/12/18 MRI Femur Right revealed Signal in the region of increased  activity on PET scan correlates with red marrow but is subtly more focal and decreased on T1 weighted imaging compared to other imaged bones. Given activity on PET, the finding is worrisome for focal lymphoma involvement.  07/15/2019 CT chest wo contrast revealed "1. Multiple small pulmonary nodules measuring 3-4 mm in size, nonspecific, but favored to be benign. No follow-up needed if patient is low-risk (and has no known or suspected primary neoplasm). Non-contrast chest CT can be considered in 12 months if patient is high-risk. This recommendation follows the consensus statement: Guidelines for Management of Incidental Pulmonary NodulesDetected on CT Images: From the Fleischner Society 2017; Radiology 2017; 284:228-243. 2. Aortic atherosclerosis, in addition to left anterior descending coronary artery disease. Please note that although the presence of coronary artery calcium documents the presence of coronary artery disease, the severity of this disease and any potential stenosis cannot be assessed on this non-gated CT examination. Assessment for potential risk factor modification, dietary therapy or pharmacologic therapy may be warranted, if clinically indicated. 3. Small hiatal hernia."  07/15/2019 DG femur which revealed "Faint lucency in  the proximal right femoral shaft which corresponds to the lesion seen on prior MRI and PET-CT."  PLAN  -Had labs today WBC count was stable at 13.8k with lymphocytes of 4.8k.  With normal hemoglobin of 12.5 and normal platelets of 271k -CMP stable, LDH within normal limits. -Patient previously had left femoral hypermetabolic lesion which is why this was considered to be possible CLL despite lymphocyte counts not crossing the threshold of 5000. -We will get repeat CT chest abdomen pelvis to evaluate for lymphadenopathy splenomegaly and other signs of lymphoma.  #2 RBC microcytosis with mild anemia Is iron deficient . Lab Results  Component Value Date   IRON  33 (L) 09/30/2021   TIBC 430 09/30/2021   IRONPCTSAT 8 (L) 09/30/2021   (Iron and TIBC)  Lab Results  Component Value Date   FERRITIN 11 09/30/2021   PLAN -We discussed that she is on aspirin and Celebrex which can increase the risk of postoperative bleeding. -Follow-up with primary care physician to do fecal occult blood testing and consider referral to GI for evaluation of iron deficiency. -Patient is currently not significantly anemic and can pursue oral iron replacement with her primary care physician. -Management of aspirin and NSAIDs per primary care physician.  #3 vitamin B12 deficiency Could be an absorption issue or could be from metformin PLan -Would recommend sublingual B12 1000 mcg p.o. daily along with 1 B complex capsule p.o. daily over-the-counter to help other associated B vitamin deficiency.  Follow-up .Labs today CT chest abdomen pelvis in 1 week Phone visit Dr. Irene Limbo in 10 to 12 days  Orders Placed This Encounter  Procedures   CT CHEST ABDOMEN PELVIS W CONTRAST    Standing Status:   Future    Standing Expiration Date:   09/30/2022    Order Specific Question:   Preferred imaging location?    Answer:   [facility]    Order Specific Question:   Is Oral Contrast requested for this exam?    Answer:   Yes, Per Radiology protocol    Order Specific Question:   Reason for Exam (SYMPTOM  OR DIAGNOSIS REQUIRED)    Answer:   Patient with CLL versus monoclonal B lymphocytosis to evaluate for signs of lymphadenopathy splenomegaly or other lymphoma involvement   CBC with Differential/Platelet    Standing Status:   Future    Number of Occurrences:   1    Standing Expiration Date:   09/30/2022   CMP ([place] only)    Standing Status:   Future    Number of Occurrences:   1    Standing Expiration Date:   09/30/2022   Lactate dehydrogenase    Standing Status:   Future    Number of Occurrences:   1    Standing Expiration Date:   09/30/2022   Ferritin     Standing Status:   Future    Number of Occurrences:   1    Standing Expiration Date:   09/30/2022   Iron and TIBC    Standing Status:   Future    Number of Occurrences:   1    Standing Expiration Date:   09/30/2022   Vitamin B12    Standing Status:   Future    Number of Occurrences:   1    Standing Expiration Date:   09/30/2022    All of the patients questions were answered with apparent satisfaction. The patient knows to call the clinic with any problems, questions or concerns.  Sullivan Lone MD [place] AAHIVMS [facility] Hematology/Oncology Physician [facility]

## 2021-10-11 ENCOUNTER — Encounter (HOSPITAL_COMMUNITY): Payer: Self-pay

## 2021-10-11 ENCOUNTER — Ambulatory Visit (HOSPITAL_COMMUNITY)
Admission: RE | Admit: 2021-10-11 | Discharge: 2021-10-11 | Disposition: A | Discharge status: Home / Self Care | Payer: Medicare HMO | Source: Ambulatory Visit | Attending: Hematology | Admitting: Hematology

## 2021-10-11 DIAGNOSIS — K5730 Diverticulosis of large intestine without perforation or abscess without bleeding: Secondary | ICD-10-CM | POA: Insufficient documentation

## 2021-10-11 DIAGNOSIS — K8020 Calculus of gallbladder without cholecystitis without obstruction: Secondary | ICD-10-CM | POA: Insufficient documentation

## 2021-10-11 DIAGNOSIS — D72820 Lymphocytosis (symptomatic): Secondary | ICD-10-CM | POA: Diagnosis not present

## 2021-10-11 DIAGNOSIS — D259 Leiomyoma of uterus, unspecified: Secondary | ICD-10-CM | POA: Diagnosis not present

## 2021-10-11 DIAGNOSIS — J8410 Pulmonary fibrosis, unspecified: Secondary | ICD-10-CM | POA: Diagnosis not present

## 2021-10-11 DIAGNOSIS — M4328 Fusion of spine, sacral and sacrococcygeal region: Secondary | ICD-10-CM | POA: Diagnosis not present

## 2021-10-11 DIAGNOSIS — I700 Atherosclerosis of aorta: Secondary | ICD-10-CM | POA: Diagnosis not present

## 2021-10-11 DIAGNOSIS — K769 Liver disease, unspecified: Secondary | ICD-10-CM | POA: Diagnosis not present

## 2021-10-11 DIAGNOSIS — I2510 Atherosclerotic heart disease of native coronary artery without angina pectoris: Secondary | ICD-10-CM | POA: Diagnosis not present

## 2021-10-11 MED ORDER — IOHEXOL 350 MG/ML SOLN
75.0000 mL | Freq: Once | INTRAVENOUS | Status: AC | PRN
  Administered 2021-10-11: 75 mL via INTRAVENOUS

## 2021-10-11 MED ORDER — SODIUM CHLORIDE (PF) 0.9 % IJ SOLN
INTRAMUSCULAR | Status: AC
  Filled 2021-10-11: qty 50

## 2021-10-13 ENCOUNTER — Inpatient Hospital Stay (HOSPITAL_BASED_OUTPATIENT_CLINIC_OR_DEPARTMENT_OTHER): Payer: Medicare HMO | Admitting: Hematology

## 2021-10-13 DIAGNOSIS — D72820 Lymphocytosis (symptomatic): Secondary | ICD-10-CM

## 2021-10-13 DIAGNOSIS — D509 Iron deficiency anemia, unspecified: Secondary | ICD-10-CM | POA: Diagnosis not present

## 2021-10-14 ENCOUNTER — Telehealth: Payer: Self-pay | Admitting: Hematology

## 2021-10-14 NOTE — Telephone Encounter (Signed)
Scheduled follow-up appointment per 12/15 los. Patient is aware.

## 2021-10-31 NOTE — Progress Notes (Signed)
HEMATOLOGY/ONCOLOGY CLINIC NOTE  Date of Service: .10/13/2021   Patient Care Team: Lawerance Cruel, MD as PCP - General (Family Medicine)  CHIEF COMPLAINTS/PURPOSE OF CONSULTATION:  Phone visit to discuss CLL labs and imaging studies.  HISTORY OF PRESENTING ILLNESS:  Please see previous notes for details on initial presentation  Interval History:   .I connected with Elmore Guise on 10/31/21 at  1:20 PM EST by telephone visit and verified that I am speaking with the correct person using two identifiers.   I discussed the limitations, risks, security and privacy concerns of performing an evaluation and management service by telemedicine and the availability of in-person appointments. I also discussed with the patient that there may be a patient responsible charge related to this service. The patient expressed understanding and agreed to proceed.   Other persons participating in the visit and their role in the encounter: none  Patients location: Home Providers [facility]  Chief Complaint: Discussion of labs and CT imaging done for follow-up of CLL.  Patient was called and notes no new symptoms. Labs done on 09/30/2021 shows stable leukocytosis with WBC count of 13.8 with a lymphocyte count of 4.8k.  CMP was stable RBC microcytosis was noted and patient had iron profile that shows iron deficiency anemia with a ferritin of 11 and iron saturation of 8%, B12 level is also somewhat low at 292. LDH within normal limits.  CT chest abdomen pelvis done on 10/11/2021 shows no evidence of lymphadenopathy in the chest abdomen or pelvis.  No overt splenomegaly or hepatomegaly.  incidentally noted cholelithiasis without any evidence of acute cholecystitis  MEDICAL HISTORY:  Past Medical History:  Diagnosis Date   Anemia    Back pain    CLL (chronic lymphocytic leukemia) (HCC)    Diabetes mellitus type 2, uncontrolled    Edema of both lower extremities     Hyperlipidemia    Hypertension    Hypothyroidism    Joint pain    Lumbar spondylolysis    Obesity    Sciatic nerve disease    SOB (shortness of breath)    Vitamin D deficiency     SURGICAL HISTORY: Past Surgical History:  Procedure Laterality Date   ELBOW SURGERY     KNEE SURGERY     TONSILLECTOMY AND ADENOIDECTOMY      SOCIAL HISTORY: Social History   Socioeconomic History   Marital status: Single    Spouse name: Not on file   Number of children: 0   Years of education: college   Highest education level: Not on file  Occupational History   Occupation: Retired  Tobacco Use   Smoking status: Former   Smokeless tobacco: Never  Scientific laboratory technician Use: Never used  Substance and Sexual Activity   Alcohol use: Yes    Alcohol/week: 1.0 - 2.0 standard drink    Types: 1 - 2 Glasses of wine per week    Comment: Occasional   Drug use: No   Sexual activity: Not Currently  Other Topics Concern   Not on file  Social History Narrative   Not on file   Social Determinants of Health   Financial Resource Strain: Not on file  Food Insecurity: Not on file  Transportation Needs: Not on file  Physical Activity: Not on file  Stress: Not on file  Social Connections: Not on file  Intimate Partner Violence: Not on file    FAMILY HISTORY: Family History  Problem Relation Age  of Onset   Congestive Heart Failure Mother    Hypertension Mother    Thyroid disease Mother    Cancer Mother    Anxiety disorder Mother    Diabetes Father    Heart Problems Father    Hyperlipidemia Father    Dementia Father    Thyroid disease Father    Cancer Father    Congestive Heart Failure Maternal Grandmother    Heart attack Maternal Grandfather    Heart disease Paternal Grandmother     ALLERGIES:  is allergic to invokana [canagliflozin], actos [pioglitazone], and tinactin foot-sneaker deodor [podiatric products].  MEDICATIONS:  Current Outpatient Medications  Medication Sig Dispense  Refill   aspirin 325 MG tablet Take 325 mg by mouth daily.     buPROPion (WELLBUTRIN SR) 200 MG 12 hr tablet Take 1 tablet (200 mg total) by mouth daily. 90 tablet 0   carvedilol (COREG) 3.125 MG tablet Take 1 tablet (3.125 mg total) by mouth 2 (two) times daily. 180 tablet 3   celecoxib (CELEBREX) 200 MG capsule Take 200 mg by mouth daily.     cetirizine (ZYRTEC) 10 MG tablet Take 10 mg by mouth daily.     cyclobenzaprine (FLEXERIL) 10 MG tablet Take 10 mg by mouth daily as needed for muscle spasms.     ezetimibe-simvastatin (VYTORIN) 10-40 MG tablet Take 1 tablet by mouth daily.     furosemide (LASIX) 40 MG tablet Take 40 mg by mouth.     glucose blood test strip 1 each by Other route as needed for other. Use as instructed     insulin degludec (TRESIBA) 100 UNIT/ML FlexTouch Pen Inject 30-40 Units into the skin daily. Takes 25-32 U     levothyroxine (SYNTHROID) 50 MCG tablet Take 100 mcg by mouth daily before breakfast. 100 mcg     metFORMIN (GLUCOPHAGE) 1000 MG tablet Take 1,000 mg by mouth 2 (two) times daily with a meal.     metroNIDAZOLE (METROCREAM) 0.75 % cream Apply 1 application topically as directed.     montelukast (SINGULAIR) 10 MG tablet Take 10 mg by mouth at bedtime.     niacin (NIASPAN) 1000 MG CR tablet Take 500 mg by mouth at bedtime. 500-100 mg     topiramate (TOPAMAX) 25 MG tablet TAKE 1 TABLET BY MOUTH TWICE A DAY 180 tablet 0   traZODone (DESYREL) 100 MG tablet Take 100 mg by mouth at bedtime as needed for sleep.     TRULICITY 4.5 EN/2.7PO SOPN Inject into the skin once a week.     No current facility-administered medications for this visit.    REVIEW OF SYSTEMS:   .10 Point review of Systems was done is negative except as noted above.   PHYSICAL EXAMINATION: Telemedicine visit  LABORATORY DATA:  I have reviewed the data as listed  . CBC Latest Ref Rng & Units 09/30/2021 05/11/2020 07/15/2019  WBC 4.0 - 10.5 K/uL 13.8(H) - 14.0(H)  Hemoglobin 12.0 - 15.0 g/dL  12.5 - 13.1  Hematocrit 36.0 - 46.0 % 40.4 41.9 41.8  Platelets 150 - 400 K/uL 271 - 284   . CBC    Component Value Date/Time   WBC 13.8 (H) 09/30/2021 1015   RBC 5.08 09/30/2021 1015   HGB 12.5 09/30/2021 1015   HCT 40.4 09/30/2021 1015   HCT 41.9 05/11/2020 1335   PLT 271 09/30/2021 1015   MCV 79.5 (L) 09/30/2021 1015   MCH 24.6 (L) 09/30/2021 1015   MCHC 30.9 09/30/2021 1015  RDW 16.5 (H) 09/30/2021 1015   LYMPHSABS 4.8 (H) 09/30/2021 1015   MONOABS 0.9 09/30/2021 1015   EOSABS 0.4 09/30/2021 1015   BASOSABS 0.1 09/30/2021 1015     . CMP Latest Ref Rng & Units 09/30/2021 05/11/2020 07/15/2019  Glucose 70 - 99 mg/dL 113(H) 115(H) 109(H)  BUN 8 - 23 mg/dL 19 16 11   Creatinine 0.44 - 1.00 mg/dL 0.81 0.88 0.82  Sodium 135 - 145 mmol/L 139 139 141  Potassium 3.5 - 5.1 mmol/L 4.3 4.4 4.1  Chloride 98 - 111 mmol/L 107 99 103  CO2 22 - 32 mmol/L 23 22 28   Calcium 8.9 - 10.3 mg/dL 9.7 9.6 9.7  Total Protein 6.5 - 8.1 g/dL 7.0 6.7 7.1  Total Bilirubin 0.3 - 1.2 mg/dL 0.4 0.2 0.4  Alkaline Phos 38 - 126 U/L 63 103 81  AST 15 - 41 U/L 14(L) 21 16  ALT 0 - 44 U/L 13 16 11    . Lab Results  Component Value Date   LDH 136 09/30/2021    . Lab Results  Component Value Date   IRON 33 (L) 09/30/2021   TIBC 430 09/30/2021   IRONPCTSAT 8 (L) 09/30/2021   (Iron and TIBC)  Lab Results  Component Value Date   FERRITIN 11 09/30/2021   B12 - 292  Component     Latest Ref Rng & Units 08/05/2018  Hep B Core Ab, Tot     Negative Negative  Hepatitis B Surface Ag     Negative Negative  HCV Ab     0.0 - 0.9 s/co ratio <0.1  LDH     98 - 192 U/L 175   09/09/18 CLL FISH:      07/03/18 CBC w/diff:    RADIOGRAPHIC STUDIES: I have personally reviewed the radiological images as listed and agreed with the findings in the report. CT CHEST ABDOMEN PELVIS W CONTRAST  Result Date: 10/12/2021 CLINICAL DATA:  CLL versus monoclonal B-cell lymphocytosis, assess for  lymphadenopathy splenomegaly or other lymphomatous involvement. EXAM: CT CHEST, ABDOMEN, AND PELVIS WITH CONTRAST TECHNIQUE: Multidetector CT imaging of the chest, abdomen and pelvis was performed following the standard protocol during bolus administration of intravenous contrast. CONTRAST:  4mL OMNIPAQUE IOHEXOL 350 MG/ML SOLN COMPARISON:  Chest CT July 15, 2019 and PET-CT September 18, 2018 FINDINGS: CT CHEST FINDINGS Cardiovascular: Aortic atherosclerosis without aneurysmal dilation. No central pulmonary embolus on this nondedicated study. Coronary artery calcifications. Normal size heart. No significant pericardial effusion/thickening. Mediastinum/Nodes: No supraclavicular adenopathy. No discrete thyroid nodule. No pathologically enlarged mediastinal, hilar or axillary lymph nodes. The trachea and esophagus are grossly unremarkable. Lungs/Pleura: Stable bilateral 3-4 mm pulmonary nodules. No new suspicious pulmonary nodules or masses. Calcified right lower lobe granuloma. No pleural effusion. No pneumothorax. Musculoskeletal: No aggressive lytic or blastic lesion of bone. Multilevel degenerative changes spine. Degenerative change of the shoulders. CT ABDOMEN PELVIS FINDINGS Hepatobiliary: Hypodense 7 mm lesion in the left hepatic lobe on image 50/2 is technically too small to accurately characterize but stable dating back to September 18, 2018, consistent with a benign etiology. No suspicious hepatic lesion. Cholelithiasis without findings of acute cholecystitis. Pancreas: No pancreatic ductal dilation or evidence of acute inflammation. Spleen: Spleen is upper limits of normal measuring 13 cm in maximum axial dimension, unchanged from prior PET-CT. No focal abnormality. Adrenals/Urinary Tract: Bilateral adrenal glands are unremarkable. No hydronephrosis. Tiny bilateral hypodense renal lesions are technically too small to accurately characterize but statistically likely to reflect cysts. Urinary bladder is  decompressed limiting evaluation. Stomach/Bowel: Radiopaque enteric contrast material traverses the hepatic flexure. Stomach is unremarkable. No pathologic dilation of small or large bowel. The appendix and terminal ileum appear normal. Colonic diverticulosis without findings of acute diverticulitis. Vascular/Lymphatic: Aortic and branch vessel atherosclerosis without abdominal aortic aneurysm. No pathologically enlarged abdominal or pelvic lymph nodes. Reproductive: Calcified uterine leiomyoma. No suspicious adnexal masses. Other: Soft tissue stranding in the anterior abdominal subcutaneous soft tissues, nonspecific possibly infectious/inflammatory could reflect sequela of trauma or subcutaneous injections. No significant abdominopelvic free fluid. Musculoskeletal: Multilevel degenerative changes spine. No aggressive lytic or blastic lesion of bone. Partial bony ankylosis of the right sacroiliac joint. IMPRESSION: 1. No adenopathy within the chest, abdomen, or pelvis. 2. Upper limits of normal splenic size without discrete splenomegaly or focal splenic lesion, similar to prior PET-CT September 18, 2018. 3. Cholelithiasis without findings of acute cholecystitis. 4. Colonic diverticulosis without findings of acute diverticulitis. 5. Soft tissue stranding in the anterior abdominal subcutaneous soft tissues, nonspecific possibly infectious/inflammatory could reflect sequela of trauma or subcutaneous injections. 6.  Aortic Atherosclerosis (ICD10-I70.0). Electronically Signed   By: Dahlia Bailiff M.D.   On: 10/12/2021 10:49     ASSESSMENT & PLAN:  76 y.o. female with  1.  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  08/05/18 Peripheral Blood Flow cytometry revealed CD5 positive monoclonal B-cell population at comprising 49% of total lymphocytes  08/05/18 Hep B and Hep C serologies neg   09/09/18 FISH CLL Prognostic panel revealed 17% of cells with  mono-allelic 46K deletion, 59% of cells with bi-allelic 93T deletion, and 49% cells with normal nuclei.   09/18/18 PET/CT revealed Focal hypermetabolism in the proximal right femoral metaphysis without discrete bone lesion on the CT images, suspicious for osseous lymphoma. 2. No additional metabolic evidence of lymphoma. No hypermetabolic lymphadenopathy. Normal size non-hypermetabolic spleen. 3. Few scattered solid pulmonary nodules, largest 7 mm, below PET resolution. Recommend attention on follow-up chest CT in 3-6 months. 4. Chronic findings include: Aortic Atherosclerosis. Coronary atherosclerosis. Small hiatal hernia. Moderate colonic diverticulosis. Cholelithiasis. Myomatous uterus.   10/12/18 MRI Femur Right revealed Signal in the region of increased activity on PET scan correlates with red marrow but is subtly more focal and decreased on T1 weighted imaging compared to other imaged bones. Given activity on PET, the finding is worrisome for focal lymphoma involvement.  07/15/2019 CT chest wo contrast revealed "1. Multiple small pulmonary nodules measuring 3-4 mm in size, nonspecific, but favored to be benign. No follow-up needed if patient is low-risk (and has no known or suspected primary neoplasm). Non-contrast chest CT can be considered in 12 months if patient is high-risk. This recommendation follows the consensus statement: Guidelines for Management of Incidental Pulmonary NodulesDetected on CT Images: From the Fleischner Society 2017; Radiology 2017; 284:228-243. 2. Aortic atherosclerosis, in addition to left anterior descending coronary artery disease. Please note that although the presence of coronary artery calcium documents the presence of coronary artery disease, the severity of this disease and any potential stenosis cannot be assessed on this non-gated CT examination. Assessment for potential risk factor modification, dietary therapy or pharmacologic therapy may be warranted, if  clinically indicated. 3. Small hiatal hernia."  07/15/2019 DG femur which revealed "Faint lucency in the proximal right femoral shaft which corresponds to the lesion seen on prior MRI and PET-CT."  PLAN  -Labs done on 09/30/2021 were reviewed with the patient in details.  Mild low normal hemoglobin with microcytosis related  to iron deficiency. WBC counts slightly elevated but stable at 13.8k with lymphocytes of 4.8k that do not numerically meet criteria of CLL. LDH within normal limits -Patient previously had left femoral hypermetabolic lesion which is why this was considered to be possible CLL despite lymphocyte counts not crossing the threshold of 5000. CT chest abdomen pelvis shows no evidence of lymphadenopathy, hepatosplenomegaly or bone lesions to suggest any overt evidence of CLL at this time. -We discussed and made a shared decision not to pursue additional imaging for the left femur at this time in the absence of symptoms.  #2 RBC microcytosis with mild anemia Patient has iron deficiency She has been on chronic NSAIDs with the risk of NSAID related gastropathy and bleeding. . Lab Results  Component Value Date   IRON 33 (L) 09/30/2021   TIBC 430 09/30/2021   IRONPCTSAT 8 (L) 09/30/2021   (Iron and TIBC)  Lab Results  Component Value Date   FERRITIN 11 09/30/2021   PLAN -We again discussed that she is on aspirin and Celebrex which can increase the risk of NSAID gastropathy and GI bleeding. -Follow-up with primary care physician to do fecal occult blood testing and consider referral to GI for evaluation of iron deficiency. -Patient is currently not significantly anemic and can pursue oral iron replacement with her primary care physician. -Management of aspirin and NSAIDs per primary care physician.  #3 vitamin B12 deficiency Could be an absorption issue or could be from metformin PLan -Would recommend sublingual B12 1000 mcg p.o. daily along with 1 B complex capsule p.o.  daily over-the-counter to help other associated B vitamin deficiency.  Follow-up RTC with Dr Irene Limbo with labs in 12 months F/u with PCP for evaluation and mx of IDA   All of the patients questions were answered with apparent satisfaction. The patient knows to call the clinic with any problems, questions or concerns.   Sullivan Lone MD [place] [facility]IV[facility] Hematology/Oncology Physician [facility]

## 2021-11-03 ENCOUNTER — Telehealth: Payer: Medicare HMO | Admitting: Hematology

## 2021-11-29 ENCOUNTER — Encounter (INDEPENDENT_AMBULATORY_CARE_PROVIDER_SITE_OTHER): Payer: Self-pay | Admitting: Bariatrics

## 2021-11-29 ENCOUNTER — Other Ambulatory Visit: Payer: Self-pay

## 2021-11-29 ENCOUNTER — Ambulatory Visit (INDEPENDENT_AMBULATORY_CARE_PROVIDER_SITE_OTHER): Payer: Medicare HMO | Admitting: Bariatrics

## 2021-11-29 VITALS — BP 121/85 | HR 84 | Temp 97.4°F | Ht 64.0 in | Wt 200.0 lb

## 2021-11-29 DIAGNOSIS — Z6834 Body mass index (BMI) 34.0-34.9, adult: Secondary | ICD-10-CM | POA: Diagnosis not present

## 2021-11-29 DIAGNOSIS — I10 Essential (primary) hypertension: Secondary | ICD-10-CM | POA: Diagnosis not present

## 2021-11-29 DIAGNOSIS — R69 Illness, unspecified: Secondary | ICD-10-CM | POA: Diagnosis not present

## 2021-11-29 DIAGNOSIS — F5089 Other specified eating disorder: Secondary | ICD-10-CM

## 2021-11-29 DIAGNOSIS — E669 Obesity, unspecified: Secondary | ICD-10-CM

## 2021-11-29 MED ORDER — BUPROPION HCL ER (SR) 200 MG PO TB12: 200.0000 mg | ORAL_TABLET | Freq: Every day | ORAL | 0 refills | Status: DC

## 2021-11-29 MED ORDER — TOPIRAMATE 25 MG PO TABS: 25.0000 mg | ORAL_TABLET | Freq: Two times a day (BID) | ORAL | 0 refills | Status: DC

## 2021-11-29 NOTE — Progress Notes (Signed)
Chief Complaint:   OBESITY Cassandra Palmer is here to discuss her progress with her obesity treatment plan along with follow-up of her obesity related diagnoses. Cassandra Palmer is on the Category 2 Plan and states she is following her eating plan approximately 40% of the time. Cassandra Palmer states she is doing chair exercise for 40 minutes 3 times per week.  Today's visit was #: 16 Starting weight: 239 lbs Starting date: 09/16/2020 Today's weight: 200 lbs Today's date: 11/29/2021 Total lbs lost to date: 39 lbs Total lbs lost since last in-office visit: 4 lbs  Interim History: Cassandra Palmer is down an additional 4 lbs since her last visit. She is doing better with her plan.  Subjective:   1. Essential hypertension Cassandra Palmer's blood pressure is controlled. Her blood pressure was 121/85 today.  2. Other disorder of eating Cassandra Palmer is taking her medications as directed. She states her cravings has improved.  Assessment/Plan:   1. Essential hypertension Cassandra Palmer will continue taking her medications. She is working on healthy weight loss and exercise to improve blood pressure control. We will watch for signs of hypotension as she continues her lifestyle modifications.  2. Other disorder of eating We will refill Topamax 25 mg for 3 months with no refills. We will refill Wellbutrin 200 mg for 1 month with no refills. Behavior modification techniques were discussed today to help Cassandra Palmer deal with her emotional/non-hunger eating behaviors.  Orders and follow up as documented in patient record.    - buPROPion (WELLBUTRIN SR) 200 MG 12 hr tablet; Take 1 tablet (200 mg total) by mouth daily.  Dispense: 90 tablet; Refill: 0 - topiramate (TOPAMAX) 25 MG tablet; Take 1 tablet (25 mg total) by mouth 2 (two) times daily.  Dispense: 180 tablet; Refill: 0  3. Obesity with current BMI of 34.4 Cassandra Palmer is currently in the action stage of change. As such, her goal is to continue with weight loss efforts. She has agreed to the  Category 2 Plan.   Cassandra Palmer will adhere closely to the plan. She will keep protein high and carbohydrates low.  Exercise goals:  As is. Cassandra Palmer will get back to exercise swing in March.  Behavioral modification strategies: increasing lean protein intake, decreasing simple carbohydrates, increasing vegetables, increasing water intake, decreasing eating out, no skipping meals, meal planning and cooking strategies, keeping healthy foods in the home, and planning for success.  Cassandra Palmer has agreed to follow-up with our clinic in 3 months. She was informed of the importance of frequent follow-up visits to maximize her success with intensive lifestyle modifications for her multiple health conditions.   Objective:   Blood pressure 121/85, pulse 84, temperature (!) 97.4 F (36.3 C), height 5\' 4"  (1.626 m), weight 200 lb (90.7 kg), SpO2 97 %. Body mass index is 34.33 kg/m.  General: Cooperative, alert, well developed, in no acute distress. HEENT: Conjunctivae and lids unremarkable. Cardiovascular: Regular rhythm.  Lungs: Normal work of breathing. Neurologic: No focal deficits.   Lab Results  Component Value Date   CREATININE 0.81 09/30/2021   BUN 19 09/30/2021   NA 139 09/30/2021   K 4.3 09/30/2021   CL 107 09/30/2021   CO2 23 09/30/2021   Lab Results  Component Value Date   ALT 13 09/30/2021   AST 14 (L) 09/30/2021   ALKPHOS 63 09/30/2021   BILITOT 0.4 09/30/2021   Lab Results  Component Value Date   HGBA1C 7.1 (H) 05/11/2020   Lab Results  Component Value Date   INSULIN 4.3 05/11/2020  Lab Results  Component Value Date   TSH 5.110 (H) 05/11/2020   Lab Results  Component Value Date   CHOL 147 05/11/2020   HDL 47 05/11/2020   LDLCALC 76 05/11/2020   TRIG 140 05/11/2020   Lab Results  Component Value Date   VD25OH 64.3 07/06/2020   VD25OH 103.0 (H) 05/11/2020   Lab Results  Component Value Date   WBC 13.8 (H) 09/30/2021   HGB 12.5 09/30/2021   HCT 40.4  09/30/2021   MCV 79.5 (L) 09/30/2021   PLT 271 09/30/2021   Lab Results  Component Value Date   IRON 33 (L) 09/30/2021   TIBC 430 09/30/2021   FERRITIN 11 09/30/2021   Attestation Statements:   Reviewed by clinician on day of visit: allergies, medications, problem list, medical history, surgical history, family history, social history, and previous encounter notes.  I, Lizbeth Bark, RMA, am acting as Location manager for [employer], DO.  I have reviewed the above documentation for accuracy and completeness, and I agree with the above. Jearld Lesch, DO

## 2021-12-14 DIAGNOSIS — Z1231 Encounter for screening mammogram for malignant neoplasm of breast: Secondary | ICD-10-CM | POA: Diagnosis not present

## 2021-12-14 DIAGNOSIS — Z780 Asymptomatic menopausal state: Secondary | ICD-10-CM | POA: Diagnosis not present

## 2022-02-16 DIAGNOSIS — Z7984 Long term (current) use of oral hypoglycemic drugs: Secondary | ICD-10-CM | POA: Diagnosis not present

## 2022-02-16 DIAGNOSIS — E119 Type 2 diabetes mellitus without complications: Secondary | ICD-10-CM | POA: Diagnosis not present

## 2022-02-16 DIAGNOSIS — H524 Presbyopia: Secondary | ICD-10-CM | POA: Diagnosis not present

## 2022-02-16 DIAGNOSIS — Z961 Presence of intraocular lens: Secondary | ICD-10-CM | POA: Diagnosis not present

## 2022-02-19 ENCOUNTER — Other Ambulatory Visit (INDEPENDENT_AMBULATORY_CARE_PROVIDER_SITE_OTHER): Payer: Self-pay | Admitting: Bariatrics

## 2022-02-19 DIAGNOSIS — F5089 Other specified eating disorder: Secondary | ICD-10-CM

## 2022-02-23 ENCOUNTER — Ambulatory Visit (INDEPENDENT_AMBULATORY_CARE_PROVIDER_SITE_OTHER): Payer: Medicare HMO | Admitting: Bariatrics

## 2022-02-23 ENCOUNTER — Encounter (INDEPENDENT_AMBULATORY_CARE_PROVIDER_SITE_OTHER): Payer: Self-pay | Admitting: Bariatrics

## 2022-02-23 VITALS — BP 138/82 | HR 84 | Temp 97.7°F | Ht 64.0 in | Wt 190.0 lb

## 2022-02-23 DIAGNOSIS — Z6832 Body mass index (BMI) 32.0-32.9, adult: Secondary | ICD-10-CM | POA: Diagnosis not present

## 2022-02-23 DIAGNOSIS — F5089 Other specified eating disorder: Secondary | ICD-10-CM | POA: Diagnosis not present

## 2022-02-23 DIAGNOSIS — R69 Illness, unspecified: Secondary | ICD-10-CM | POA: Diagnosis not present

## 2022-02-23 DIAGNOSIS — E669 Obesity, unspecified: Secondary | ICD-10-CM | POA: Diagnosis not present

## 2022-02-23 DIAGNOSIS — E66812 Obesity, class 2: Secondary | ICD-10-CM

## 2022-02-23 DIAGNOSIS — I10 Essential (primary) hypertension: Secondary | ICD-10-CM | POA: Diagnosis not present

## 2022-02-23 MED ORDER — TOPIRAMATE 25 MG PO TABS: 25.0000 mg | ORAL_TABLET | Freq: Two times a day (BID) | ORAL | 0 refills | Status: DC

## 2022-03-03 NOTE — Progress Notes (Signed)
? ? ? ?Chief Complaint:  ? ?OBESITY ?Cassandra Palmer is here to discuss her progress with her obesity treatment plan along with follow-up of her obesity related diagnoses. Cassandra Palmer is on the Category 2 Plan and states she is following her eating plan approximately 85% of the time. Cassandra Palmer states she is swimming 45 minutes 3-4 times per week. ? ?Today's visit was #: 65 ?Starting weight: 239 lbs ?Starting date: 09/16/2020 ?Today's weight: 190 lbs ?Today's date: 02/23/2022 ?Total lbs lost to date: 49 lbs ?Total lbs lost since last in-office visit: 10 lbs ? ?Interim History: Cassandra Palmer is down 10 lbs since her last visit on 11/29/21. She is doing well with her water and protein. ? ?Subjective:  ? ?1. Essential hypertension ?Cassandra Palmer hypertension is controlled. ? ?2. Other disorder of eating ?Cassandra Palmer is taking Wellbutrin and Topamax, it helps with emotional eating. ? ?Assessment/Plan:  ? ?1. Essential hypertension ?Cassandra Palmer will continue taking medication. ? ?2. Other disorder of eating ?We will refill Topamax 25 mg for 3 months with no refills. We will refill Wellbutrin 200 mg for 1 month with no refills. Behavior modification techniques were discussed today to help Cassandra Palmer deal with her emotional/non-hunger eating behaviors.  Orders and follow up as documented in patient record.   ?- topiramate (TOPAMAX) 25 MG tablet; Take 1 tablet (25 mg total) by mouth 2 (two) times daily.  Dispense: 180 tablet; Refill: 0 ? ?3. Obesity with current BMI of 32.7 ?Cassandra Palmer is currently in the action stage of change. As such, her goal is to continue with weight loss efforts. She has agreed to the Category 2 Plan.  ? ?Cassandra Palmer agreed to meal planning and intentional eating, "Eating Out" sheet was give to Cassandra Palmer. ?Exercise goals: Cassandra Palmer will continue to do some exercising and swimming. ? ?Behavioral modification strategies: increasing lean protein intake, decreasing simple carbohydrates, increasing vegetables, increasing water intake, decreasing eating  out, no skipping meals, meal planning and cooking strategies, keeping healthy foods in the home, and planning for success. ? ?Cassandra Palmer has agreed to follow-up with our clinic in 3 weeks. She was informed of the importance of frequent follow-up visits to maximize her success with intensive lifestyle modifications for her multiple health conditions.  ? ?Objective:  ? ?Blood pressure 138/82, pulse 84, temperature 97.7 ?F (36.5 ?C), height '5\' 4"'$  (1.626 m), weight 190 lb (86.2 kg), SpO2 98 %. ?Body mass index is 32.61 kg/m?. ? ?General: Cooperative, alert, well developed, in no acute distress. ?HEENT: Conjunctivae and lids unremarkable. ?Cardiovascular: Regular rhythm.  ?Lungs: Normal work of breathing. ?Neurologic: No focal deficits.  ? ?Lab Results  ?Component Value Date  ? CREATININE 0.81 09/30/2021  ? BUN 19 09/30/2021  ? NA 139 09/30/2021  ? K 4.3 09/30/2021  ? CL 107 09/30/2021  ? CO2 23 09/30/2021  ? ?Lab Results  ?Component Value Date  ? ALT 13 09/30/2021  ? AST 14 (L) 09/30/2021  ? ALKPHOS 63 09/30/2021  ? BILITOT 0.4 09/30/2021  ? ?Lab Results  ?Component Value Date  ? HGBA1C 7.1 (H) 05/11/2020  ? ?Lab Results  ?Component Value Date  ? INSULIN 4.3 05/11/2020  ? ?Lab Results  ?Component Value Date  ? TSH 5.110 (H) 05/11/2020  ? ?Lab Results  ?Component Value Date  ? CHOL 147 05/11/2020  ? HDL 47 05/11/2020  ? [place] 76 05/11/2020  ? TRIG 140 05/11/2020  ? ?Lab Results  ?Component Value Date  ? VD25OH 64.3 07/06/2020  ? VD25OH 103.0 (H) 05/11/2020  ? ?Lab Results  ?Component Value  Date  ? WBC 13.8 (H) 09/30/2021  ? HGB 12.5 09/30/2021  ? HCT 40.4 09/30/2021  ? MCV 79.5 (L) 09/30/2021  ? PLT 271 09/30/2021  ? ?Lab Results  ?Component Value Date  ? IRON 33 (L) 09/30/2021  ? TIBC 430 09/30/2021  ? FERRITIN 11 09/30/2021  ? ? ?Obesity Behavioral Intervention:  ? ?Approximately 15 minutes were spent on the discussion below. ? ?ASK: ?We discussed the diagnosis of obesity with Cassandra Palmer today and Cassandra Palmer agreed to give [place]  permission to discuss obesity behavioral modification therapy today. ? ?ASSESS: ?Cassandra Palmer has the diagnosis of obesity and her BMI today is 32.7. Cassandra Palmer is in the action stage of change.  ? ?ADVISE: ?Cassandra Palmer was educated on the multiple health risks of obesity as well as the benefit of weight loss to improve her health. She was advised of the need for long term treatment and the importance of lifestyle modifications to improve her current health and to decrease her risk of future health problems. ? ?AGREE: ?Multiple dietary modification options and treatment options were discussed and Cassandra Palmer agreed to follow the recommendations documented in the above note. ? ?ARRANGE: ?Cassandra Palmer was educated on the importance of frequent visits to treat obesity as outlined per CMS and USPSTF guidelines and agreed to schedule her next follow up appointment today. ? ?Attestation Statements:  ? ?Reviewed by clinician on day of visit: allergies, medications, problem list, medical history, surgical history, family history, social history, and previous encounter notes ?Onnie Boer CMA, am acting as Location manager for [employer] ? ?I have reviewed the above documentation for accuracy and completeness, and I agree with the above. Jearld Lesch, DO ? ?

## 2022-03-06 ENCOUNTER — Encounter (INDEPENDENT_AMBULATORY_CARE_PROVIDER_SITE_OTHER): Payer: Self-pay | Admitting: Bariatrics

## 2022-03-13 DIAGNOSIS — E039 Hypothyroidism, unspecified: Secondary | ICD-10-CM | POA: Diagnosis not present

## 2022-03-13 DIAGNOSIS — E1169 Type 2 diabetes mellitus with other specified complication: Secondary | ICD-10-CM | POA: Diagnosis not present

## 2022-03-15 DIAGNOSIS — G4710 Hypersomnia, unspecified: Secondary | ICD-10-CM | POA: Diagnosis not present

## 2022-03-15 DIAGNOSIS — E1169 Type 2 diabetes mellitus with other specified complication: Secondary | ICD-10-CM | POA: Diagnosis not present

## 2022-03-15 DIAGNOSIS — M5136 Other intervertebral disc degeneration, lumbar region: Secondary | ICD-10-CM | POA: Diagnosis not present

## 2022-03-15 DIAGNOSIS — E668 Other obesity: Secondary | ICD-10-CM | POA: Diagnosis not present

## 2022-03-15 DIAGNOSIS — M5430 Sciatica, unspecified side: Secondary | ICD-10-CM | POA: Diagnosis not present

## 2022-05-23 ENCOUNTER — Other Ambulatory Visit (INDEPENDENT_AMBULATORY_CARE_PROVIDER_SITE_OTHER): Payer: Self-pay | Admitting: Bariatrics

## 2022-05-23 DIAGNOSIS — F5089 Other specified eating disorder: Secondary | ICD-10-CM

## 2022-05-24 ENCOUNTER — Ambulatory Visit (INDEPENDENT_AMBULATORY_CARE_PROVIDER_SITE_OTHER): Payer: Medicare HMO | Admitting: Bariatrics

## 2022-05-24 ENCOUNTER — Encounter (INDEPENDENT_AMBULATORY_CARE_PROVIDER_SITE_OTHER): Payer: Self-pay | Admitting: Bariatrics

## 2022-05-24 VITALS — BP 117/75 | HR 83 | Temp 97.3°F | Ht 64.0 in | Wt 182.0 lb

## 2022-05-24 DIAGNOSIS — Z7985 Long-term (current) use of injectable non-insulin antidiabetic drugs: Secondary | ICD-10-CM | POA: Diagnosis not present

## 2022-05-24 DIAGNOSIS — E669 Obesity, unspecified: Secondary | ICD-10-CM | POA: Diagnosis not present

## 2022-05-24 DIAGNOSIS — E1169 Type 2 diabetes mellitus with other specified complication: Secondary | ICD-10-CM

## 2022-05-24 DIAGNOSIS — Z6831 Body mass index (BMI) 31.0-31.9, adult: Secondary | ICD-10-CM | POA: Diagnosis not present

## 2022-05-24 DIAGNOSIS — Z7984 Long term (current) use of oral hypoglycemic drugs: Secondary | ICD-10-CM

## 2022-05-24 DIAGNOSIS — I10 Essential (primary) hypertension: Secondary | ICD-10-CM

## 2022-05-24 DIAGNOSIS — F509 Eating disorder, unspecified: Secondary | ICD-10-CM | POA: Insufficient documentation

## 2022-05-24 DIAGNOSIS — F5089 Other specified eating disorder: Secondary | ICD-10-CM | POA: Diagnosis not present

## 2022-05-24 DIAGNOSIS — R69 Illness, unspecified: Secondary | ICD-10-CM | POA: Diagnosis not present

## 2022-05-24 MED ORDER — BUPROPION HCL ER (SR) 200 MG PO TB12: 200.0000 mg | ORAL_TABLET | Freq: Every day | ORAL | 0 refills | Status: DC

## 2022-06-05 ENCOUNTER — Encounter (INDEPENDENT_AMBULATORY_CARE_PROVIDER_SITE_OTHER): Payer: Self-pay | Admitting: Bariatrics

## 2022-06-05 NOTE — Progress Notes (Signed)
Chief Complaint:   OBESITY Cassandra Palmer is here to discuss her progress with her obesity treatment plan along with follow-up of her obesity related diagnoses. Cassandra Palmer is on the Category 2 Plan and states she is following her eating plan approximately 40% of the time. Cassandra Palmer states she is doing water aerobics for 45 minutes 4-5 times per week.  Today's visit was #: 18 Starting weight: 239 lbs Starting date: 09/16/2020 Today's weight: 182 lbs Today's date: 05/24/2022 Total lbs lost to date: 57 Total lbs lost since last in-office visit: 8  Interim History: Cassandra Palmer is down 8 pounds since her last visit.  She is drinking adequate water.  Subjective:   1. Essential hypertension Cassandra Palmer's blood pressure is controlled.  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  3. Other disorder of eating Cassandra Palmer is taking Wellbutrin as directed.  She notes cravings are pretty much under control.  Assessment/Plan:   1. Essential hypertension Cassandra Palmer will continue her medications as directed, and we will follow-up at her next visit.  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  3. Other disorder of eating We will refill Wellbutrin SR 200 mg once daily for 90 days, with no refills.  - buPROPion (WELLBUTRIN SR) 200 MG 12 hr tablet; Take 1 tablet (200 mg total) by mouth daily.  Dispense: 90 tablet; Refill: 0  4. Obesity with current BMI of 31.2 Cassandra Palmer is currently in the action stage of change. As such, her goal is to continue with weight loss efforts. She has agreed to the Category 2 Plan.   Meal planning was discussed.  Will adhere closely to the plan.  Increase protein.  Exercise goals: As is.  Behavioral modification strategies: increasing lean protein intake, decreasing simple carbohydrates, increasing vegetables, increasing water intake, decreasing eating out, no skipping  meals, meal planning and cooking strategies, keeping healthy foods in the home, and planning for success.  Cassandra Palmer has agreed to follow-up with our clinic in 3 months. She was informed of the importance of frequent follow-up visits to maximize her success with intensive lifestyle modifications for her multiple health conditions.   Objective:   Blood pressure 117/75, pulse 83, temperature (!) 97.3 F (36.3 C), height '5\' 4"'$  (1.626 m), weight 182 lb (82.6 kg), SpO2 95 %. Body mass index is 31.24 kg/m.  General: Cooperative, alert, well developed, in no acute distress. HEENT: Conjunctivae and lids unremarkable. Cardiovascular: Regular rhythm.  Lungs: Normal work of breathing. Neurologic: No focal deficits.   Lab Results  Component Value Date   CREATININE 0.81 09/30/2021   BUN 19 09/30/2021   NA 139 09/30/2021   K 4.3 09/30/2021   CL 107 09/30/2021   CO2 23 09/30/2021   Lab Results  Component Value Date   ALT 13 09/30/2021   AST 14 (L) 09/30/2021   ALKPHOS 63 09/30/2021   BILITOT 0.4 09/30/2021   Lab Results  Component Value Date   HGBA1C 7.1 (H) 05/11/2020   Lab Results  Component Value Date   INSULIN 4.3 05/11/2020   Lab Results  Component Value Date   TSH 5.110 (H) 05/11/2020   Lab Results  Component Value Date   CHOL 147 05/11/2020   HDL 47 05/11/2020   LDLCALC 76 05/11/2020   TRIG 140 05/11/2020   Lab Results  Component Value Date   VD25OH 64.3 07/06/2020   VD25OH 103.0 (H) 05/11/2020   Lab  Results  Component Value Date   WBC 13.8 (H) 09/30/2021   HGB 12.5 09/30/2021   HCT 40.4 09/30/2021   MCV 79.5 (L) 09/30/2021   PLT 271 09/30/2021   Lab Results  Component Value Date   IRON 33 (L) 09/30/2021   TIBC 430 09/30/2021   FERRITIN 11 09/30/2021   Attestation Statements:   Reviewed by clinician on day of visit: allergies, medications, problem list, medical history, surgical history, family history, social history, and previous encounter notes.  Wilhemena Durie, am acting as Location manager for [employer], DO.  I have reviewed the above documentation for accuracy and completeness, and I agree with the above. Jearld Lesch, DO

## 2022-06-07 ENCOUNTER — Encounter (INDEPENDENT_AMBULATORY_CARE_PROVIDER_SITE_OTHER): Payer: Self-pay

## 2022-07-04 ENCOUNTER — Other Ambulatory Visit (INDEPENDENT_AMBULATORY_CARE_PROVIDER_SITE_OTHER): Payer: Self-pay | Admitting: Bariatrics

## 2022-07-04 DIAGNOSIS — F5089 Other specified eating disorder: Secondary | ICD-10-CM

## 2022-07-05 ENCOUNTER — Other Ambulatory Visit (INDEPENDENT_AMBULATORY_CARE_PROVIDER_SITE_OTHER): Payer: Self-pay | Admitting: Bariatrics

## 2022-07-05 ENCOUNTER — Encounter: Payer: Self-pay | Admitting: Bariatrics

## 2022-07-05 DIAGNOSIS — F5089 Other specified eating disorder: Secondary | ICD-10-CM

## 2022-07-06 ENCOUNTER — Other Ambulatory Visit (INDEPENDENT_AMBULATORY_CARE_PROVIDER_SITE_OTHER): Payer: Self-pay | Admitting: Bariatrics

## 2022-07-06 DIAGNOSIS — F5089 Other specified eating disorder: Secondary | ICD-10-CM

## 2022-07-06 MED ORDER — TOPIRAMATE 25 MG PO TABS: 25.0000 mg | ORAL_TABLET | Freq: Two times a day (BID) | ORAL | 0 refills | Status: DC

## 2022-07-07 ENCOUNTER — Telehealth: Payer: Self-pay

## 2022-07-07 NOTE — Patient Outreach (Signed)
  Care Coordination   07/07/2022 Name: Cassandra Palmer MRN: [id number] DOB: 02-Jan-1946   Care Coordination Outreach Attempts:  An unsuccessful telephone outreach was attempted today to offer the patient information about available care coordination services as a benefit of their health plan.   Follow Up Plan:  Additional outreach attempts will be made to offer the patient care coordination information and services.   Encounter Outcome:  No Answer  Care Coordination Interventions Activated:  No   Care Coordination Interventions:  No, not indicated    [facility] [phone]

## 2022-07-07 NOTE — Patient Instructions (Signed)
Visit Information  Thank you for taking time to visit with me today. Please don't hesitate to contact me if I can be of assistance to you.

## 2022-07-07 NOTE — Patient Outreach (Signed)
Care Coordination   Initial Visit Note   07/07/2022 Name: Cassandra Palmer MRN: [id number] DOB: September 17, 1946  Cassandra Palmer is a 76 y.o. year old female who sees Daisy Floro, MD for primary care. I spoke with  Cassandra Palmer by phone today.  What matters to the patients health and wellness today?  Health Maintenance    Goals Addressed             This Visit's Progress    Health Maintenance       Care Coordination Interventions: Reviewed current treatment plans for DM and HTN management. Reports adhering to plan and attending medical appointments as scheduled. Current A1C 6.5% Reviewed medications. Reports managing well. Taking Trulicity and insulin as prescribed. Denies concerns r/t medication management or prescription cost. Discussed blood pressure parameters and reviewed readings. Reports monitoring as advised. Reports readings have been within range. Reports attending health and wellness with Dr. Manson Passey. 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 Assessed social determinant of health barriers. Last AWV was completed on 09/27/21 Patient denies urgent concerns or further needs. Will contact clinic or call for assistance as needed.           SDOH assessments and interventions completed:  Yes  SDOH Interventions Today    Flowsheet Row Most Recent Value  SDOH Interventions   Food Insecurity Interventions Intervention Not Indicated  Transportation Interventions Intervention Not Indicated        Care Coordination Interventions Activated:  Yes  Care Coordination Interventions:  Yes, provided   Follow up plan: No further intervention required.   Encounter Outcome:  Patient Visit Completed   Katha Cabal [facility] [phone]

## 2022-08-21 ENCOUNTER — Ambulatory Visit: Payer: Self-pay | Admitting: Bariatrics

## 2022-08-21 ENCOUNTER — Ambulatory Visit (INDEPENDENT_AMBULATORY_CARE_PROVIDER_SITE_OTHER): Payer: Medicare HMO | Admitting: Bariatrics

## 2022-08-21 ENCOUNTER — Encounter: Payer: Self-pay | Admitting: Bariatrics

## 2022-08-21 VITALS — BP 148/82 | HR 94 | Temp 97.4°F | Ht 64.0 in | Wt 176.0 lb

## 2022-08-21 DIAGNOSIS — E669 Obesity, unspecified: Secondary | ICD-10-CM

## 2022-08-21 DIAGNOSIS — E1169 Type 2 diabetes mellitus with other specified complication: Secondary | ICD-10-CM

## 2022-08-21 DIAGNOSIS — Z6830 Body mass index (BMI) 30.0-30.9, adult: Secondary | ICD-10-CM

## 2022-08-21 DIAGNOSIS — Z794 Long term (current) use of insulin: Secondary | ICD-10-CM

## 2022-08-21 DIAGNOSIS — F5089 Other specified eating disorder: Secondary | ICD-10-CM | POA: Diagnosis not present

## 2022-08-21 DIAGNOSIS — R69 Illness, unspecified: Secondary | ICD-10-CM | POA: Diagnosis not present

## 2022-08-21 MED ORDER — BUPROPION HCL ER (SR) 200 MG PO TB12: 200.0000 mg | ORAL_TABLET | Freq: Every day | ORAL | 0 refills | Status: DC

## 2022-08-21 MED ORDER — TOPIRAMATE 25 MG PO TABS: 25.0000 mg | ORAL_TABLET | Freq: Two times a day (BID) | ORAL | 0 refills | Status: DC

## 2022-08-27 NOTE — Progress Notes (Signed)
Chief Complaint:   OBESITY Cassandra Palmer is here to discuss her progress with her obesity treatment plan along with follow-up of her obesity related diagnoses. Cassandra Palmer is on the Category 2 Plan and states she is following her eating plan approximately 25% of the time. Cassandra Palmer states she is doing aerobics for 45 minutes 3 times per week.  Today's visit was #: 35 Starting weight: 239 lbs Starting date: 09/16/2020 Today's weight: 176 lbs Today's date: 08/21/2022 Total lbs lost to date: 63 Total lbs lost since last in-office visit: 6  Interim History: Cassandra Palmer is here for follow-up visit.  She is down an additional 6 pounds and she is doing well overall.  Subjective:   1. Type 2 diabetes mellitus with obesity ([facility]) (See medications).  Cassandra Palmer decrease Tresiba to 16 units.  2. Other disorder of eating Jolanda is taking her medications as directed.  Assessment/Plan:   1. Type 2 diabetes mellitus with obesity ([place]) L[place]is will continue her medications and she will follow the guidelines per her PCP.  2. Other disorder of eating Cassandra Palmer will continue on her medications, and we will refill Topamax and Wellbutrin SR for 90 days.  - topiramate (TOPAMAX) 25 MG tablet; Take 1 tablet (25 mg total) by mouth 2 (two) times daily.  Dispense: 180 tablet; Refill: 0 - buPROPion (WELLBUTRIN SR) 200 MG 12 hr tablet; Take 1 tablet (200 mg total) by mouth daily.  Dispense: 90 tablet; Refill: 0  3. Obesity with current BMI of 30.2 Cassandra Palmer is currently in the action stage of change. As such, her goal is to continue with weight loss efforts. She has agreed to the Category 2 Plan and keeping a food journal and adhering to recommended goals of 1200 calories and 80 grams of protein.   She will adhere closely to her plan (calories and protein) 80-90%.  Fiber handout was given.  Exercise goals: No exercise has been prescribed at this time.  Behavioral modification strategies: increasing lean protein intake,  decreasing simple carbohydrates, increasing vegetables, increasing water intake, decreasing eating out, no skipping meals, meal planning and cooking strategies, and keeping healthy foods in the home.  Cassandra Palmer has agreed to follow-up with our clinic in 3 months. She was informed of the importance of frequent follow-up visits to maximize her success with intensive lifestyle modifications for her multiple health conditions.   Objective:   Blood pressure (!) 148/82, pulse 94, temperature (!) 97.4 F (36.3 C), height '5\' 4"'$  (1.626 m), weight 176 lb (79.8 kg), SpO2 99 %. Body mass index is 30.21 kg/m.  General: Cooperative, alert, well developed, in no acute distress. HEENT: Conjunctivae and lids unremarkable. Cardiovascular: Regular rhythm.  Lungs: Normal work of breathing. Neurologic: No focal deficits.   Lab Results  Component Value Date   CREATININE 0.81 09/30/2021   BUN 19 09/30/2021   NA 139 09/30/2021   K 4.3 09/30/2021   CL 107 09/30/2021   CO2 23 09/30/2021   Lab Results  Component Value Date   ALT 13 09/30/2021   AST 14 (L) 09/30/2021   ALKPHOS 63 09/30/2021   BILITOT 0.4 09/30/2021   Lab Results  Component Value Date   HGBA1C 7.1 (H) 05/11/2020   Lab Results  Component Value Date   INSULIN 4.3 05/11/2020   Lab Results  Component Value Date   TSH 5.110 (H) 05/11/2020   Lab Results  Component Value Date   CHOL 147 05/11/2020   HDL 47 05/11/2020   LDLCALC 76 05/11/2020  TRIG 140 05/11/2020   Lab Results  Component Value Date   VD25OH 64.3 07/06/2020   VD25OH 103.0 (H) 05/11/2020   Lab Results  Component Value Date   WBC 13.8 (H) 09/30/2021   HGB 12.5 09/30/2021   HCT 40.4 09/30/2021   MCV 79.5 (L) 09/30/2021   PLT 271 09/30/2021   Lab Results  Component Value Date   IRON 33 (L) 09/30/2021   TIBC 430 09/30/2021   FERRITIN 11 09/30/2021   Attestation Statements:   Reviewed by clinician on day of visit: allergies, medications, problem list,  medical history, surgical history, family history, social history, and previous encounter notes.   Wilhemena Durie, am acting as Location manager for [employer], DO.  I have reviewed the above documentation for accuracy and completeness, and I agree with the above. Jearld Lesch, DO

## 2022-09-07 ENCOUNTER — Encounter: Payer: Self-pay | Admitting: Bariatrics

## 2022-09-14 DIAGNOSIS — D649 Anemia, unspecified: Secondary | ICD-10-CM | POA: Diagnosis not present

## 2022-09-14 DIAGNOSIS — E782 Mixed hyperlipidemia: Secondary | ICD-10-CM | POA: Diagnosis not present

## 2022-09-14 DIAGNOSIS — I10 Essential (primary) hypertension: Secondary | ICD-10-CM | POA: Diagnosis not present

## 2022-09-14 DIAGNOSIS — E559 Vitamin D deficiency, unspecified: Secondary | ICD-10-CM | POA: Diagnosis not present

## 2022-09-14 DIAGNOSIS — E1169 Type 2 diabetes mellitus with other specified complication: Secondary | ICD-10-CM | POA: Diagnosis not present

## 2022-09-14 DIAGNOSIS — E039 Hypothyroidism, unspecified: Secondary | ICD-10-CM | POA: Diagnosis not present

## 2022-10-12 DIAGNOSIS — M5136 Other intervertebral disc degeneration, lumbar region: Secondary | ICD-10-CM | POA: Diagnosis not present

## 2022-10-12 DIAGNOSIS — E1169 Type 2 diabetes mellitus with other specified complication: Secondary | ICD-10-CM | POA: Diagnosis not present

## 2022-10-12 DIAGNOSIS — E039 Hypothyroidism, unspecified: Secondary | ICD-10-CM | POA: Diagnosis not present

## 2022-10-12 DIAGNOSIS — E782 Mixed hyperlipidemia: Secondary | ICD-10-CM | POA: Diagnosis not present

## 2022-10-12 DIAGNOSIS — C9110 Chronic lymphocytic leukemia of B-cell type not having achieved remission: Secondary | ICD-10-CM | POA: Diagnosis not present

## 2022-10-12 DIAGNOSIS — G4700 Insomnia, unspecified: Secondary | ICD-10-CM | POA: Diagnosis not present

## 2022-10-12 DIAGNOSIS — J309 Allergic rhinitis, unspecified: Secondary | ICD-10-CM | POA: Diagnosis not present

## 2022-10-12 DIAGNOSIS — I10 Essential (primary) hypertension: Secondary | ICD-10-CM | POA: Diagnosis not present

## 2022-10-12 DIAGNOSIS — Z0000 Encounter for general adult medical examination without abnormal findings: Secondary | ICD-10-CM | POA: Diagnosis not present

## 2022-10-12 DIAGNOSIS — B351 Tinea unguium: Secondary | ICD-10-CM | POA: Diagnosis not present

## 2022-10-12 DIAGNOSIS — M5430 Sciatica, unspecified side: Secondary | ICD-10-CM | POA: Diagnosis not present

## 2022-10-12 DIAGNOSIS — E559 Vitamin D deficiency, unspecified: Secondary | ICD-10-CM | POA: Diagnosis not present

## 2022-10-13 ENCOUNTER — Other Ambulatory Visit: Payer: Medicare HMO

## 2022-10-13 ENCOUNTER — Ambulatory Visit: Payer: Medicare HMO | Admitting: Hematology

## 2022-10-26 ENCOUNTER — Other Ambulatory Visit: Payer: Self-pay

## 2022-10-26 DIAGNOSIS — D72820 Lymphocytosis (symptomatic): Secondary | ICD-10-CM

## 2022-10-31 ENCOUNTER — Inpatient Hospital Stay: Payer: Medicare HMO | Attending: Hematology

## 2022-10-31 ENCOUNTER — Inpatient Hospital Stay (HOSPITAL_BASED_OUTPATIENT_CLINIC_OR_DEPARTMENT_OTHER): Payer: Medicare HMO | Admitting: Hematology

## 2022-10-31 ENCOUNTER — Other Ambulatory Visit: Payer: Self-pay

## 2022-10-31 VITALS — BP 130/85 | HR 99 | Temp 97.2°F | Resp 14 | Wt 177.1 lb

## 2022-10-31 DIAGNOSIS — E538 Deficiency of other specified B group vitamins: Secondary | ICD-10-CM | POA: Insufficient documentation

## 2022-10-31 DIAGNOSIS — D72820 Lymphocytosis (symptomatic): Secondary | ICD-10-CM | POA: Diagnosis not present

## 2022-10-31 DIAGNOSIS — R7401 Elevation of levels of liver transaminase levels: Secondary | ICD-10-CM | POA: Insufficient documentation

## 2022-10-31 DIAGNOSIS — D509 Iron deficiency anemia, unspecified: Secondary | ICD-10-CM | POA: Diagnosis not present

## 2022-10-31 DIAGNOSIS — E611 Iron deficiency: Secondary | ICD-10-CM | POA: Diagnosis not present

## 2022-10-31 LAB — CBC WITH DIFFERENTIAL (CANCER CENTER ONLY)
Abs Immature Granulocytes: 0.05 10*3/uL (ref 0.00–0.07)
Basophils Absolute: 0.1 10*3/uL (ref 0.0–0.1)
Basophils Relative: 1 %
Eosinophils Absolute: 0.6 10*3/uL — ABNORMAL HIGH (ref 0.0–0.5)
Eosinophils Relative: 4 %
HCT: 41 % (ref 36.0–46.0)
Hemoglobin: 13.1 g/dL (ref 12.0–15.0)
Immature Granulocytes: 0 %
Lymphocytes Relative: 44 %
Lymphs Abs: 7 10*3/uL — ABNORMAL HIGH (ref 0.7–4.0)
MCH: 26.8 pg (ref 26.0–34.0)
MCHC: 32 g/dL (ref 30.0–36.0)
MCV: 83.8 fL (ref 80.0–100.0)
Monocytes Absolute: 1.1 10*3/uL — ABNORMAL HIGH (ref 0.1–1.0)
Monocytes Relative: 7 %
Neutro Abs: 7.1 10*3/uL (ref 1.7–7.7)
Neutrophils Relative %: 44 %
Platelet Count: 272 10*3/uL (ref 150–400)
RBC: 4.89 MIL/uL (ref 3.87–5.11)
RDW: 14.1 % (ref 11.5–15.5)
WBC Count: 15.8 10*3/uL — ABNORMAL HIGH (ref 4.0–10.5)
nRBC: 0 % (ref 0.0–0.2)

## 2022-10-31 LAB — CMP (CANCER CENTER ONLY)
ALT: 55 U/L — ABNORMAL HIGH (ref 0–44)
AST: 29 U/L (ref 15–41)
Albumin: 4.2 g/dL (ref 3.5–5.0)
Alkaline Phosphatase: 73 U/L (ref 38–126)
Anion gap: 7 (ref 5–15)
BUN: 17 mg/dL (ref 8–23)
CO2: 31 mmol/L (ref 22–32)
Calcium: 9.9 mg/dL (ref 8.9–10.3)
Chloride: 99 mmol/L (ref 98–111)
Creatinine: 0.78 mg/dL (ref 0.44–1.00)
GFR, Estimated: >60 mL/min (ref >60)
Glucose, Bld: 222 mg/dL — ABNORMAL HIGH (ref 70–99)
Potassium: 3.5 mmol/L (ref 3.5–5.1)
Sodium: 137 mmol/L (ref 135–145)
Total Bilirubin: 0.4 mg/dL (ref 0.3–1.2)
Total Protein: 6.9 g/dL (ref 6.5–8.1)

## 2022-10-31 LAB — IRON AND IRON BINDING CAPACITY (CC-WL,HP ONLY)
Iron: 66 ug/dL (ref 28–170)
Saturation Ratios: 14 % (ref 10.4–31.8)
TIBC: 480 ug/dL — ABNORMAL HIGH (ref 250–450)
UIBC: 414 ug/dL (ref 148–442)

## 2022-10-31 LAB — VITAMIN B12: Vitamin B-12: 584 pg/mL (ref 180–914)

## 2022-10-31 LAB — FERRITIN: Ferritin: 10 ng/mL — ABNORMAL LOW (ref 11–307)

## 2022-10-31 LAB — LACTATE DEHYDROGENASE: LDH: 140 U/L (ref 98–192)

## 2022-10-31 NOTE — Progress Notes (Signed)
HEMATOLOGY/ONCOLOGY CLINIC NOTE  Date of Service: 10/31/22   Patient Care Team: Lawerance Cruel, MD as PCP - General (Family Medicine)  CHIEF COMPLAINTS/PURPOSE OF CONSULTATION:  Follow-up for continued evaluation and management of CLL  HISTORY OF PRESENTING ILLNESS:  Please see previous notes for details on initial presentation  Interval History:   Cassandra Palmer is a wonderful 77 y.o. female who is here for continued evaluation and management of her CLL. She is here for one year follow-up.  I had a phone visit with the patient on 10/13/2021 and patient was doing well without any new medical concerns. During the phone visit, we discussed her CT Chest Abdomin Pelvis results which showed no evidence of lymphadenopathy in the chest abdomen or pelvis.  Patient notes she has been doing well without any new medical concerns since our last visit. She denies any new bone pain, infection issues, low energy level, fever, chills, night sweats, abdominal pain, abnormal bowl moments, chest pain, or leg swelling. She complains of consistent back pain, which has not changed since our last visit.   She reports she has not been taking iron supplements, but is regularly taking B-12 supplement. Patient is complaint with her medications. She takes Aspirin as needed for back pain. She also takes Celebrex 200 mg prn for back pain.  She has not had any GI evaluation which was recommended to her PCP. Labs done today were discussed with the patient in detail.   MEDICAL HISTORY:  Past Medical History:  Diagnosis Date   Anemia    Back pain    CLL (chronic lymphocytic leukemia) (HCC)    Diabetes mellitus type 2, uncontrolled    Edema of both lower extremities    Hyperlipidemia    Hypertension    Hypothyroidism    Joint pain    Lumbar spondylolysis    Obesity    Sciatic nerve disease    SOB (shortness of breath)    Vitamin D deficiency     SURGICAL HISTORY: Past Surgical History:   Procedure Laterality Date   ELBOW SURGERY     KNEE SURGERY     TONSILLECTOMY AND ADENOIDECTOMY      SOCIAL HISTORY: Social History   Socioeconomic History   Marital status: Single    Spouse name: Not on file   Number of children: 0   Years of education: college   Highest education level: Not on file  Occupational History   Occupation: Retired  Tobacco Use   Smoking status: Former   Smokeless tobacco: Never  Scientific laboratory technician Use: Never used  Substance and Sexual Activity   Alcohol use: Yes    Alcohol/week: 1.0 - 2.0 standard drink of alcohol    Types: 1 - 2 Glasses of wine per week    Comment: Occasional   Drug use: No   Sexual activity: Not Currently  Other Topics Concern   Not on file  Social History Narrative   Not on file   Social Determinants of Health   Financial Resource Strain: Not on file  Food Insecurity: No Food Insecurity (07/07/2022)   Hunger Vital Sign    Worried About Running Out of Food in the Last Year: Never true    Ran Out of Food in the Last Year: Never true  Transportation Needs: No Transportation Needs (07/07/2022)   PRAPARE - Hydrologist (Medical): No    Lack of Transportation (Non-Medical): No  Physical Activity: Not on file  Stress: Not on file  Social Connections: Not on file  Intimate Partner Violence: Not on file    FAMILY HISTORY: Family History  Problem Relation Age of Onset   Congestive Heart Failure Mother    Hypertension Mother    Thyroid disease Mother    Cancer Mother    Anxiety disorder Mother    Diabetes Father    Heart Problems Father    Hyperlipidemia Father    Dementia Father    Thyroid disease Father    Cancer Father    Congestive Heart Failure Maternal Grandmother    Heart attack Maternal Grandfather    Heart disease Paternal Grandmother     ALLERGIES:  is allergic to invokana [canagliflozin], actos [pioglitazone], and tinactin foot-sneaker deodor [podiatric  products].  MEDICATIONS:  Current Outpatient Medications  Medication Sig Dispense Refill   buPROPion (WELLBUTRIN SR) 200 MG 12 hr tablet Take 1 tablet (200 mg total) by mouth daily. 90 tablet 0   carvedilol (COREG) 3.125 MG tablet Take 1 tablet (3.125 mg total) by mouth 2 (two) times daily. 180 tablet 3   celecoxib (CELEBREX) 200 MG capsule Take 200 mg by mouth daily.     cetirizine (ZYRTEC) 10 MG tablet Take 10 mg by mouth daily.     cyclobenzaprine (FLEXERIL) 10 MG tablet Take 10 mg by mouth daily as needed for muscle spasms.     ezetimibe-simvastatin (VYTORIN) 10-40 MG tablet Take 1 tablet by mouth daily.     furosemide (LASIX) 40 MG tablet Take 40 mg by mouth.     glucose blood test strip 1 each by Other route as needed for other. Use as instructed     insulin degludec (TRESIBA) 100 UNIT/ML FlexTouch Pen Inject 30-40 Units into the skin daily. Takes 25-32 U     metFORMIN (GLUCOPHAGE) 1000 MG tablet Take 1,000 mg by mouth 2 (two) times daily with a meal.     metroNIDAZOLE (METROCREAM) 0.75 % cream Apply 1 application topically as directed.     montelukast (SINGULAIR) 10 MG tablet Take 10 mg by mouth at bedtime.     niacin (NIASPAN) 1000 MG CR tablet Take 500 mg by mouth at bedtime. 500-100 mg     topiramate (TOPAMAX) 25 MG tablet Take 1 tablet (25 mg total) by mouth 2 (two) times daily. 180 tablet 0   traZODone (DESYREL) 100 MG tablet Take 100 mg by mouth at bedtime as needed for sleep.     TRULICITY 4.5 DE/0.8XK SOPN Inject into the skin once a week.     No current facility-administered medications for this visit.    REVIEW OF SYSTEMS:   10 Point review of Systems was done is negative except as noted above.  PHYSICAL EXAMINATION: .BP 130/85 (BP Location: Left Wrist, Patient Position: Sitting)   Pulse 99   Temp (!) 97.2 F (36.2 C) (Oral)   Resp 14   Wt 177 lb 1.6 oz (80.3 kg)   SpO2 100%   BMI 30.40 kg/m  . GENERAL:alert, in no acute distress and comfortable SKIN: no acute  rashes, no significant lesions EYES: conjunctiva are pink and non-injected, sclera anicteric OROPHARYNX: MMM, no exudates, no oropharyngeal erythema or ulceration NECK: supple, no JVD LYMPH:  no palpable lymphadenopathy in the cervical, axillary or inguinal regions LUNGS: clear to auscultation b/l with normal respiratory effort HEART: regular rate & rhythm ABDOMEN:  normoactive bowel sounds , non tender, not distended. Extremity: no pedal edema PSYCH: alert & oriented x 3 with fluent speech NEURO:  no focal motor/sensory deficits   LABORATORY DATA:  I have reviewed the data as listed  .    Latest Ref Rng & Units 10/31/2022   11:14 AM 09/30/2021   10:15 AM 05/11/2020    1:35 PM  CBC  WBC 4.0 - 10.5 K/uL 15.8  13.8    Hemoglobin 12.0 - 15.0 g/dL 13.1  12.5    Hematocrit 36.0 - 46.0 % 41.0  40.4  41.9   Platelets 150 - 400 K/uL 272  271     . CBC    Component Value Date/Time   WBC 15.8 (H) 10/31/2022 1114   WBC 13.8 (H) 09/30/2021 1015   RBC 4.89 10/31/2022 1114   HGB 13.1 10/31/2022 1114   HCT 41.0 10/31/2022 1114   HCT 41.9 05/11/2020 1335   PLT 272 10/31/2022 1114   MCV 83.8 10/31/2022 1114   MCH 26.8 10/31/2022 1114   MCHC 32.0 10/31/2022 1114   RDW 14.1 10/31/2022 1114   LYMPHSABS 7.0 (H) 10/31/2022 1114   MONOABS 1.1 (H) 10/31/2022 1114   EOSABS 0.6 (H) 10/31/2022 1114   BASOSABS 0.1 10/31/2022 1114     .    Latest Ref Rng & Units 10/31/2022   11:14 AM 09/30/2021   10:15 AM 05/11/2020    1:35 PM  CMP  Glucose 70 - 99 mg/dL 222  113  115   BUN 8 - 23 mg/dL '17  19  16   '$ Creatinine 0.44 - 1.00 mg/dL 0.78  0.81  0.88   Sodium 135 - 145 mmol/L 137  139  139   Potassium 3.5 - 5.1 mmol/L 3.5  4.3  4.4   Chloride 98 - 111 mmol/L 99  107  99   CO2 22 - 32 mmol/L '31  23  22   '$ Calcium 8.9 - 10.3 mg/dL 9.9  9.7  9.6   Total Protein 6.5 - 8.1 g/dL 6.9  7.0  6.7   Total Bilirubin 0.3 - 1.2 mg/dL 0.4  0.4  0.2   Alkaline Phos 38 - 126 U/L 73  63  103   AST 15 - 41 U/L  '29  14  21   '$ ALT 0 - 44 U/L 55  13  16    . Lab Results  Component Value Date   LDH 140 10/31/2022    . Lab Results  Component Value Date   IRON 66 10/31/2022   TIBC 480 (H) 10/31/2022   IRONPCTSAT 14 10/31/2022   (Iron and TIBC)  Lab Results  Component Value Date   FERRITIN 10 (L) 10/31/2022   B12 - 292---> 584  Component     Latest Ref Rng & Units 08/05/2018  Hep B Core Ab, Tot     Negative Negative  Hepatitis B Surface Ag     Negative Negative  HCV Ab     0.0 - 0.9 s/co ratio <0.1  LDH     98 - 192 U/L 175   09/09/18 CLL FISH:      07/03/18 CBC w/diff:    RADIOGRAPHIC STUDIES: I have personally reviewed the radiological images as listed and agreed with the findings in the report. No results found.   ASSESSMENT & PLAN:  77 y.o. female with  1.  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  08/05/18 Peripheral Blood Flow cytometry revealed CD5 positive monoclonal B-cell population at comprising 49% of total lymphocytes  08/05/18 Hep B and Hep C serologies  neg   09/09/18 FISH CLL Prognostic panel revealed 17% of cells with mono-allelic 46K deletion, 59% of cells with bi-allelic 93T deletion, and 49% cells with normal nuclei.   09/18/18 PET/CT revealed Focal hypermetabolism in the proximal right femoral metaphysis without discrete bone lesion on the CT images, suspicious for osseous lymphoma. 2. No additional metabolic evidence of lymphoma. No hypermetabolic lymphadenopathy. Normal size non-hypermetabolic spleen. 3. Few scattered solid pulmonary nodules, largest 7 mm, below PET resolution. Recommend attention on follow-up chest CT in 3-6 months. 4. Chronic findings include: Aortic Atherosclerosis. Coronary atherosclerosis. Small hiatal hernia. Moderate colonic diverticulosis. Cholelithiasis. Myomatous uterus.   10/12/18 MRI Femur Right revealed Signal in the region of increased activity on PET scan  correlates with red marrow but is subtly more focal and decreased on T1 weighted imaging compared to other imaged bones. Given activity on PET, the finding is worrisome for focal lymphoma involvement.  07/15/2019 CT chest wo contrast revealed "1. Multiple small pulmonary nodules measuring 3-4 mm in size, nonspecific, but favored to be benign. No follow-up needed if patient is low-risk (and has no known or suspected primary neoplasm). Non-contrast chest CT can be considered in 12 months if patient is high-risk. This recommendation follows the consensus statement: Guidelines for Management of Incidental Pulmonary NodulesDetected on CT Images: From the Fleischner Society 2017; Radiology 2017; 284:228-243. 2. Aortic atherosclerosis, in addition to left anterior descending coronary artery disease. Please note that although the presence of coronary artery calcium documents the presence of coronary artery disease, the severity of this disease and any potential stenosis cannot be assessed on this non-gated CT examination. Assessment for potential risk factor modification, dietary therapy or pharmacologic therapy may be warranted, if clinically indicated. 3. Small hiatal hernia."  07/15/2019 DG femur which revealed "Faint lucency in the proximal right femoral shaft which corresponds to the lesion seen on prior MRI and PET-CT."  #2 RBC microcytosis without anemia related to iron deficiency She has been on chronic NSAIDs with the risk of NSAID related gastropathy and bleeding. . Lab Results  Component Value Date   IRON 66 10/31/2022   TIBC 480 (H) 10/31/2022   IRONPCTSAT 14 10/31/2022   (Iron and TIBC)  Lab Results  Component Value Date   FERRITIN 10 (L) 10/31/2022   #3 vitamin B12 deficiency Could be an absorption issue or could be from metformin B12 levels are improved to 584 PLAN: --Discussed lab results from today, 10/31/2022. CBC showed slightly elevated WBC of 15.8 K. CMP showed slightly  elevated ALT at 55.  -Discussed what ALT value means with the patient.  We discussed elevation of her ALT liver enzyme could be from medications including a statins or could be from fatty liver in the context of diabetes.  She was recommended to follow-up with her PCP to monitor this and consider ultrasound of the abdomen to evaluate her liver if this is persistent or worsens.  Currently has a benign abdominal examination. -We discussed that her labs and clinical symptoms do not suggest any overt symptomatic progression of her monoclonal B lymphocytosis/CLL at this time. -She continues to have iron deficiency without significant anemia.  Ferritin is still low at 10 and iron saturation at 14%.  She has not been taking her iron replacement and was recommended to start taking over-the-counter iron polysaccharide 150 mg p.o. daily and continue to pursue oral iron replacement with PCP. If she cannot tolerate oral iron or she remains iron deficient despite adequate oral iron replacement then please consult [place]  for consideration of IV iron replacement -We reiterated that she should follow-up with her primary care physician for consideration of referral to gastroenterology for GI workup for her iron deficiency. -We again discussed and educated the patient that her use of Celebrex and aspirin can cause ulcers and could be done reason for GI losses causing iron deficiency. -She should follow-up with her primary care physician for an alternative pain management plan. -Continue vitamin B-12 supplement.  B12 levels are adequate on labs today -Educated patient that Celebrex and Aspirin can cause ulcers.  -Recommended staying up to date with age-related vaccines. Recommended staying up to date with COVID-19 Booster, RSV vaccine, and influenza vaccine.    FOLLOW-UP: RTC with Dr Irene Limbo with labs in 12 months  The total time spent in the appointment was 30 minutes* .  All of the patient's questions were answered with  apparent satisfaction. The patient knows to call the clinic with any problems, questions or concerns.   Sullivan Lone MD MS AAHIVMS [facility] Hematology/Oncology Physician [facility]  .*Total Encounter Time as defined by the Centers for Medicare and Medicaid Services includes, in addition to the face-to-face time of a patient visit (documented in the note above) non-face-to-face time: obtaining and reviewing outside history, ordering and reviewing medications, tests or procedures, care coordination (communications with other health care professionals or caregivers) and documentation in the medical record.   I, Cleda Mccreedy, am acting as a Education administrator for Sullivan Lone, MD. .I have reviewed the above documentation for accuracy and completeness, and I agree with the above. Brunetta Genera MD

## 2022-11-15 DIAGNOSIS — L821 Other seborrheic keratosis: Secondary | ICD-10-CM | POA: Diagnosis not present

## 2022-11-21 ENCOUNTER — Encounter: Payer: Self-pay | Admitting: Bariatrics

## 2022-11-21 ENCOUNTER — Ambulatory Visit (INDEPENDENT_AMBULATORY_CARE_PROVIDER_SITE_OTHER): Payer: Medicare HMO | Admitting: Bariatrics

## 2022-11-21 VITALS — BP 155/79 | HR 58 | Temp 97.6°F | Ht 64.0 in | Wt 180.0 lb

## 2022-11-21 DIAGNOSIS — Z7984 Long term (current) use of oral hypoglycemic drugs: Secondary | ICD-10-CM | POA: Diagnosis not present

## 2022-11-21 DIAGNOSIS — R69 Illness, unspecified: Secondary | ICD-10-CM | POA: Diagnosis not present

## 2022-11-21 DIAGNOSIS — Z7985 Long-term (current) use of injectable non-insulin antidiabetic drugs: Secondary | ICD-10-CM

## 2022-11-21 DIAGNOSIS — E669 Obesity, unspecified: Secondary | ICD-10-CM | POA: Diagnosis not present

## 2022-11-21 DIAGNOSIS — F5089 Other specified eating disorder: Secondary | ICD-10-CM

## 2022-11-21 DIAGNOSIS — E1169 Type 2 diabetes mellitus with other specified complication: Secondary | ICD-10-CM

## 2022-11-21 DIAGNOSIS — Z6830 Body mass index (BMI) 30.0-30.9, adult: Secondary | ICD-10-CM

## 2022-11-21 MED ORDER — TOPIRAMATE 25 MG PO TABS: 25.0000 mg | ORAL_TABLET | Freq: Two times a day (BID) | ORAL | 0 refills | Status: DC

## 2022-11-21 MED ORDER — BUPROPION HCL ER (SR) 200 MG PO TB12: 200.0000 mg | ORAL_TABLET | Freq: Every day | ORAL | 0 refills | Status: DC

## 2022-11-28 NOTE — Progress Notes (Unsigned)
Chief Complaint:   OBESITY Cassandra Palmer is here to discuss her progress with her obesity treatment plan along with follow-up of her obesity related diagnoses. Katelind is on the Category 2 Plan and keeping a food journal and adhering to recommended goals of 8 calories and 80 grams of protein and states she is following her eating plan approximately 25% of the time. Garry states she is doing 0 minutes 0 times per week.  Today's visit was #: 20 Starting weight: 239 lbs Starting date: 09/16/2020 Today's weight: 180 lbs Today's date: 11/21/2022 Total lbs lost to date: 59 Total lbs lost since last in-office visit: 0  Interim History: Cassandra Palmer is up 4 lbs since her last visit, but has done well overall. She has been eating more carbohydrates over the holidays. She has pasts cravings.   Subjective:   1. Type 2 diabetes mellitus with obesity ([place]) Adalaide is taking Trulicity and metformin.   2. Other disorder of eating Cassandra Palmer is taking Topamax.   Assessment/Plan:   1. Type 2 diabetes mellitus with obesity (Cassandra Palmer) Cassandra Palmer will continue her medications as directed.   2. Other disorder of eating We refill Topamax and Wellbutrin SR for 90 days.   - topiramate (TOPAMAX) 25 MG tablet; Take 1 tablet (25 mg total) by mouth 2 (two) times daily.  Dispense: 180 tablet; Refill: 0 - buPROPion (WELLBUTRIN SR) 200 MG 12 hr tablet; Take 1 tablet (200 mg total) by mouth daily.  Dispense: 90 tablet; Refill: 0  3. Obesity with current BMI of 30.9 Cassandra Palmer is currently in the action stage of change. As such, her goal is to continue with weight loss efforts. She has agreed to the Category 2 Plan and keeping a food journal and adhering to recommended goals of 1200 calories and 80 grams of protein.   Exercise goals: Too cold for swimming, but has been trying to increase her exercise. Jogging for 45 minutes  Behavioral modification strategies: increasing lean protein intake, decreasing simple carbohydrates,  increasing vegetables, increasing water intake, decreasing eating out, no skipping meals, meal planning and cooking strategies, keeping healthy foods in the home, and planning for success.  Cassandra Palmer has agreed to follow-up with our clinic in 3 months. She was informed of the importance of frequent follow-up visits to maximize her success with intensive lifestyle modifications for her multiple health conditions.   Objective:   Blood pressure (!) 155/79, pulse (!) 58, temperature 97.6 F (36.4 C), height '5\' 4"'$  (1.626 m), SpO2 98 %. Body mass index is 30.4 kg/m.  General: Cooperative, alert, well developed, in no acute distress. HEENT: Conjunctivae and lids unremarkable. Cardiovascular: Regular rhythm.  Lungs: Normal work of breathing. Neurologic: No focal deficits.   Lab Results  Component Value Date   CREATININE 0.78 10/31/2022   BUN 17 10/31/2022   NA 137 10/31/2022   K 3.5 10/31/2022   CL 99 10/31/2022   CO2 31 10/31/2022   Lab Results  Component Value Date   ALT 55 (H) 10/31/2022   AST 29 10/31/2022   ALKPHOS 73 10/31/2022   BILITOT 0.4 10/31/2022   Lab Results  Component Value Date   HGBA1C 7.1 (H) 05/11/2020   Lab Results  Component Value Date   INSULIN 4.3 05/11/2020   Lab Results  Component Value Date   TSH 5.110 (H) 05/11/2020   Lab Results  Component Value Date   CHOL 147 05/11/2020   HDL 47 05/11/2020   LDLCALC 76 05/11/2020   TRIG 140 05/11/2020  Lab Results  Component Value Date   VD25OH 64.3 07/06/2020   VD25OH 103.0 (H) 05/11/2020   Lab Results  Component Value Date   WBC 15.8 (H) 10/31/2022   HGB 13.1 10/31/2022   HCT 41.0 10/31/2022   MCV 83.8 10/31/2022   PLT 272 10/31/2022   Lab Results  Component Value Date   IRON 66 10/31/2022   TIBC 480 (H) 10/31/2022   FERRITIN 10 (L) 10/31/2022   Attestation Statements:   Reviewed by clinician on day of visit: allergies, medications, problem list, medical history, surgical history, family  history, social history, and previous encounter notes.   Wilhemena Durie, am acting as Location manager for [employer], DO.  I have reviewed the above documentation for accuracy and completeness, and I agree with the above. Jearld Lesch, DO

## 2022-11-29 ENCOUNTER — Encounter: Payer: Self-pay | Admitting: Bariatrics

## 2022-12-20 DIAGNOSIS — Z1231 Encounter for screening mammogram for malignant neoplasm of breast: Secondary | ICD-10-CM | POA: Diagnosis not present

## 2023-02-19 DIAGNOSIS — H524 Presbyopia: Secondary | ICD-10-CM | POA: Diagnosis not present

## 2023-02-19 DIAGNOSIS — Z0100 Encounter for examination of eyes and vision without abnormal findings: Secondary | ICD-10-CM | POA: Diagnosis not present

## 2023-02-19 DIAGNOSIS — H52221 Regular astigmatism, right eye: Secondary | ICD-10-CM | POA: Diagnosis not present

## 2023-02-19 DIAGNOSIS — E119 Type 2 diabetes mellitus without complications: Secondary | ICD-10-CM | POA: Diagnosis not present

## 2023-02-19 DIAGNOSIS — H5213 Myopia, bilateral: Secondary | ICD-10-CM | POA: Diagnosis not present

## 2023-02-19 DIAGNOSIS — Z794 Long term (current) use of insulin: Secondary | ICD-10-CM | POA: Diagnosis not present

## 2023-02-19 DIAGNOSIS — Z961 Presence of intraocular lens: Secondary | ICD-10-CM | POA: Diagnosis not present

## 2023-02-28 ENCOUNTER — Encounter: Payer: Self-pay | Admitting: Bariatrics

## 2023-02-28 ENCOUNTER — Ambulatory Visit (INDEPENDENT_AMBULATORY_CARE_PROVIDER_SITE_OTHER): Payer: Medicare HMO | Admitting: Bariatrics

## 2023-02-28 VITALS — BP 140/76 | HR 78 | Ht 64.0 in | Wt 176.0 lb

## 2023-02-28 DIAGNOSIS — E1169 Type 2 diabetes mellitus with other specified complication: Secondary | ICD-10-CM

## 2023-02-28 DIAGNOSIS — F5089 Other specified eating disorder: Secondary | ICD-10-CM | POA: Diagnosis not present

## 2023-02-28 DIAGNOSIS — Z7985 Long-term (current) use of injectable non-insulin antidiabetic drugs: Secondary | ICD-10-CM

## 2023-02-28 DIAGNOSIS — Z6830 Body mass index (BMI) 30.0-30.9, adult: Secondary | ICD-10-CM | POA: Diagnosis not present

## 2023-02-28 MED ORDER — BUPROPION HCL ER (SR) 200 MG PO TB12: 200.0000 mg | ORAL_TABLET | Freq: Every day | ORAL | 0 refills | Status: DC

## 2023-02-28 MED ORDER — TOPIRAMATE 25 MG PO TABS: 25.0000 mg | ORAL_TABLET | Freq: Two times a day (BID) | ORAL | 0 refills | Status: DC

## 2023-03-06 NOTE — Progress Notes (Unsigned)
Chief Complaint:   OBESITY Cassandra Palmer is here to discuss her progress with her obesity treatment plan along with follow-up of her obesity related diagnoses. Cassandra Palmer is on the Category 2 Plan and keeping a food journal and adhering to recommended goals of 1200 calories and 80 grams of protein and states she is following her eating plan approximately 25% of the time. Cassandra Palmer states she is doing water aerobics for 45 minutes 4-6 times per week.  Today's visit was #: 21 Starting weight: 239 lbs Starting date: 09/16/2020 Today's weight: 176 lbs Today's date: 02/28/2023 Total lbs lost to date: 63 Total lbs lost since last in-office visit: 4  Interim History: Cassandra Palmer is down another 4 pounds since her last visit.  She has been eating irregularly.  Subjective:   1. Type 2 diabetes mellitus with obesity (HCC) Cassandra Palmer is taking Trulicity and metformin.  She will be going on Ozempic next week.  2. Other disorder of eating Cassandra Palmer is taking her medications as directed, and she has decreased snacking.  Assessment/Plan:   1. Type 2 diabetes mellitus with obesity (HCC) Cassandra Palmer will continue with her medications, and will continue her medications from her PCP (Ozempic).  Will decrease insulin over time.  2. Other disorder of eating Cassandra Palmer will continue her medications, and we will refill Topamax and Wellbutrin SR for 90 days.  - topiramate (TOPAMAX) 25 MG tablet; Take 1 tablet (25 mg total) by mouth 2 (two) times daily.  Dispense: 180 tablet; Refill: 0 - buPROPion (WELLBUTRIN SR) 200 MG 12 hr tablet; Take 1 tablet (200 mg total) by mouth daily.  Dispense: 90 tablet; Refill: 0  3. Morbid obesity (HCC)  4. BMI 30.0-30.9,adult Cassandra Palmer is currently in the action stage of change. As such, her goal is to continue with weight loss efforts. She has agreed to the Category 2 Plan.   Meal planning and intentional eating were discussed.  Exercise goals: As is, increase frequency to 5-7 times per  week.   Behavioral modification strategies: increasing lean protein intake, decreasing simple carbohydrates, increasing vegetables, increasing water intake, decreasing eating out, no skipping meals, meal planning and cooking strategies, keeping healthy foods in the home, and planning for success.  Cassandra Palmer has agreed to follow-up with our clinic in 3 months. She was informed of the importance of frequent follow-up visits to maximize her success with intensive lifestyle modifications for her multiple health conditions.   Objective:   Blood pressure (!) 140/76, pulse 78, height 5\' 4"  (1.626 m), weight 176 lb (79.8 kg), SpO2 97 %. Body mass index is 30.21 kg/m.  General: Cooperative, alert, well developed, in no acute distress. HEENT: Conjunctivae and lids unremarkable. Cardiovascular: Regular rhythm.  Lungs: Normal work of breathing. Neurologic: No focal deficits.   Lab Results  Component Value Date   CREATININE 0.78 10/31/2022   BUN 17 10/31/2022   NA 137 10/31/2022   K 3.5 10/31/2022   CL 99 10/31/2022   CO2 31 10/31/2022   Lab Results  Component Value Date   ALT 55 (H) 10/31/2022   AST 29 10/31/2022   ALKPHOS 73 10/31/2022   BILITOT 0.4 10/31/2022   Lab Results  Component Value Date   HGBA1C 7.1 (H) 05/11/2020   Lab Results  Component Value Date   INSULIN 4.3 05/11/2020   Lab Results  Component Value Date   TSH 5.110 (H) 05/11/2020   Lab Results  Component Value Date   CHOL 147 05/11/2020   HDL 47 05/11/2020  LDLCALC 76 05/11/2020   TRIG 140 05/11/2020   Lab Results  Component Value Date   VD25OH 64.3 07/06/2020   VD25OH 103.0 (H) 05/11/2020   Lab Results  Component Value Date   WBC 15.8 (H) 10/31/2022   HGB 13.1 10/31/2022   HCT 41.0 10/31/2022   MCV 83.8 10/31/2022   PLT 272 10/31/2022   Lab Results  Component Value Date   IRON 66 10/31/2022   TIBC 480 (H) 10/31/2022   FERRITIN 10 (L) 10/31/2022   Attestation Statements:   Reviewed by  clinician on day of visit: allergies, medications, problem list, medical history, surgical history, family history, social history, and previous encounter notes.   Trude Mcburney, am acting as Energy manager for [employer], DO.  I have reviewed the above documentation for accuracy and completeness, and I agree with the above. Corinna Capra, DO

## 2023-03-23 DIAGNOSIS — L820 Inflamed seborrheic keratosis: Secondary | ICD-10-CM | POA: Diagnosis not present

## 2023-03-23 DIAGNOSIS — L821 Other seborrheic keratosis: Secondary | ICD-10-CM | POA: Diagnosis not present

## 2023-03-23 DIAGNOSIS — D225 Melanocytic nevi of trunk: Secondary | ICD-10-CM | POA: Diagnosis not present

## 2023-03-23 DIAGNOSIS — L814 Other melanin hyperpigmentation: Secondary | ICD-10-CM | POA: Diagnosis not present

## 2023-03-23 DIAGNOSIS — D2239 Melanocytic nevi of other parts of face: Secondary | ICD-10-CM | POA: Diagnosis not present

## 2023-03-23 DIAGNOSIS — L538 Other specified erythematous conditions: Secondary | ICD-10-CM | POA: Diagnosis not present

## 2023-04-13 DIAGNOSIS — E1169 Type 2 diabetes mellitus with other specified complication: Secondary | ICD-10-CM | POA: Diagnosis not present

## 2023-05-17 DIAGNOSIS — Z794 Long term (current) use of insulin: Secondary | ICD-10-CM | POA: Diagnosis not present

## 2023-05-17 DIAGNOSIS — C9110 Chronic lymphocytic leukemia of B-cell type not having achieved remission: Secondary | ICD-10-CM | POA: Diagnosis not present

## 2023-05-17 DIAGNOSIS — I700 Atherosclerosis of aorta: Secondary | ICD-10-CM | POA: Diagnosis not present

## 2023-05-17 DIAGNOSIS — E119 Type 2 diabetes mellitus without complications: Secondary | ICD-10-CM | POA: Diagnosis not present

## 2023-06-06 ENCOUNTER — Ambulatory Visit: Payer: Medicare HMO | Admitting: Bariatrics

## 2023-06-06 ENCOUNTER — Encounter: Payer: Self-pay | Admitting: Bariatrics

## 2023-06-06 VITALS — BP 121/80 | HR 95 | Ht 64.0 in | Wt 166.0 lb

## 2023-06-06 DIAGNOSIS — E669 Obesity, unspecified: Secondary | ICD-10-CM | POA: Diagnosis not present

## 2023-06-06 DIAGNOSIS — F5089 Other specified eating disorder: Secondary | ICD-10-CM | POA: Diagnosis not present

## 2023-06-06 DIAGNOSIS — Z7985 Long-term (current) use of injectable non-insulin antidiabetic drugs: Secondary | ICD-10-CM

## 2023-06-06 DIAGNOSIS — Z6828 Body mass index (BMI) 28.0-28.9, adult: Secondary | ICD-10-CM

## 2023-06-06 DIAGNOSIS — E1169 Type 2 diabetes mellitus with other specified complication: Secondary | ICD-10-CM

## 2023-06-06 MED ORDER — BUPROPION HCL ER (SR) 200 MG PO TB12
200.0000 mg | ORAL_TABLET | Freq: Every day | ORAL | 0 refills | Status: DC
Start: 2023-06-06 — End: 2023-10-18

## 2023-06-06 NOTE — Progress Notes (Signed)
WEIGHT SUMMARY AND BIOMETRICS  Weight Lost Since Last Visit: 10lb  Vitals BP: 121/80 Pulse Rate: 95 SpO2: 99 %   Anthropometric Measurements Height: 5\' 4"  (1.626 m) Weight: 166 lb (75.3 kg) BMI (Calculated): 28.48 Weight at Last Visit: 176lb Weight Lost Since Last Visit: 10lb Starting Weight: 239lb Total Weight Loss (lbs): 73 lb (33.1 kg)   Body Composition  Body Fat %: 46.2 % Fat Mass (lbs): 76.8 lbs Muscle Mass (lbs): 84.8 lbs Total Body Water (lbs): 65.2 lbs Visceral Fat Rating : 13   Other Clinical Data Fasting: no Labs: no Today's Visit #: 22 Starting Date: 09/16/20    OBESITY Tsuruko is here to discuss her progress with her obesity treatment plan along with follow-up of her obesity related diagnoses.     Nutrition Plan: the Category 2 plan - 35% adherence.  Current exercise:  Water aerobics  Interim History:  She is down 10 lbs since her last visit.  Eating all of the food on the plan., Protein intake is as prescribed, Water intake is adequate., Denies polyphagia, and Denies excessive cravings.  Hunger is moderately controlled.  Cravings are moderately controlled.  Assessment/Plan:   Eating disorder/emotional eating Graison has had issues with stress eating and emotional eating. Currently this is moderately controlled. Overall mood is stable. Denies suicidal/homicidal ideation. Medication(s): Wellbutrin 200 mg daily in the am  Plan:  Specifically regarding patient's less desirable eating habits and patterns, we employed the technique of small changes when she cannot fully commit to her prudent nutritional plan. Consider a referral to Dr. Dewaine Conger, PhD psychologist to help with eating behaviors.  Discussed distractions to curb eating behaviors. Discussed activities to do with one's hands in the evening  Be sure to get adequate rest as lack of rest can trigger appetite.  Have plan in place for stressful events.  Consider other rewards  besides food.    Type II Diabetes She is now taking Ozempic instead of Trulicity.  Her  current HGBA1c is 6.6 per patient which was done on 04/13/23.   CBGs: Not checking      Episodes of hypoglycemia: no Medication(s): Ozempic  Lab Results  Component Value Date   HGBA1C 7.1 (H) 05/11/2020   Lab Results  Component Value Date   LDLCALC 76 05/11/2020   CREATININE 0.78 10/31/2022   No results found for: "GFR"  Plan: Continue Ozempic Continue all other medications.  Will keep all carbohydrates low both sweets and starches.  Will continue exercise regimen to 30 to 60 minutes on most days of the week.  Aim for 7 to 9 hours of sleep nightly.  Eat more low glycemic index foods.     Generalized Obesity: Current BMI BMI (Calculated): 28.48    Haylo is currently in the action stage of change. As such, her goal is to continue with weight loss efforts.  She has agreed to the Category 2 plan ( increase protein and fiber ).   Exercise goals: Older adults should determine their level of effort for physical activity relative to their level of fitness.   Behavioral modification strategies: increasing lean protein intake, decreasing simple carbohydrates , no meal skipping, increase water intake, better snacking choices, planning for success, keep healthy foods in the home, and mindful eating.  Henly has agreed to follow-up with our clinic in 3 months.       Objective:   VITALS: Per patient if applicable, see vitals. GENERAL: Alert and in no acute distress. CARDIOPULMONARY: No increased WOB. Speaking  in clear sentences.  PSYCH: Pleasant and cooperative. Speech normal rate and rhythm. Affect is appropriate. Insight and judgement are appropriate. Attention is focused, linear, and appropriate.  NEURO: Oriented as arrived to appointment on time with no prompting.   Attestation Statements:   This was prepared with the assistance of Engineer, civil (consulting).  Occasional wrong-word or  sound-a-like substitutions may have occurred due to the inherent limitations of voice recognition software.   Corinna Capra, DO

## 2023-06-07 ENCOUNTER — Encounter: Payer: Self-pay | Admitting: Bariatrics

## 2023-09-05 ENCOUNTER — Ambulatory Visit: Payer: Medicare HMO | Admitting: Bariatrics

## 2023-09-06 ENCOUNTER — Other Ambulatory Visit: Payer: Self-pay | Admitting: Bariatrics

## 2023-09-06 DIAGNOSIS — F5089 Other specified eating disorder: Secondary | ICD-10-CM

## 2023-10-11 LAB — LAB REPORT - SCANNED
A1c: 5.7
Creatinine, POC: 200 mg/dL
EGFR: 92
Microalb Creat Ratio: 27.5

## 2023-10-16 ENCOUNTER — Telehealth: Payer: Self-pay | Admitting: Hematology

## 2023-10-16 NOTE — Telephone Encounter (Signed)
Spoke with patient confirming upcoming appointment  

## 2023-10-17 DIAGNOSIS — L719 Rosacea, unspecified: Secondary | ICD-10-CM | POA: Diagnosis not present

## 2023-10-17 DIAGNOSIS — C9110 Chronic lymphocytic leukemia of B-cell type not having achieved remission: Secondary | ICD-10-CM | POA: Diagnosis not present

## 2023-10-17 DIAGNOSIS — E039 Hypothyroidism, unspecified: Secondary | ICD-10-CM | POA: Diagnosis not present

## 2023-10-17 DIAGNOSIS — I10 Essential (primary) hypertension: Secondary | ICD-10-CM | POA: Diagnosis not present

## 2023-10-17 DIAGNOSIS — G4700 Insomnia, unspecified: Secondary | ICD-10-CM | POA: Diagnosis not present

## 2023-10-17 DIAGNOSIS — Z0000 Encounter for general adult medical examination without abnormal findings: Secondary | ICD-10-CM | POA: Diagnosis not present

## 2023-10-17 DIAGNOSIS — E119 Type 2 diabetes mellitus without complications: Secondary | ICD-10-CM | POA: Diagnosis not present

## 2023-10-17 DIAGNOSIS — Z6828 Body mass index (BMI) 28.0-28.9, adult: Secondary | ICD-10-CM | POA: Diagnosis not present

## 2023-10-17 DIAGNOSIS — M5430 Sciatica, unspecified side: Secondary | ICD-10-CM | POA: Diagnosis not present

## 2023-10-17 DIAGNOSIS — E559 Vitamin D deficiency, unspecified: Secondary | ICD-10-CM | POA: Diagnosis not present

## 2023-10-17 DIAGNOSIS — E782 Mixed hyperlipidemia: Secondary | ICD-10-CM | POA: Diagnosis not present

## 2023-10-17 DIAGNOSIS — D649 Anemia, unspecified: Secondary | ICD-10-CM | POA: Diagnosis not present

## 2023-10-18 ENCOUNTER — Encounter: Payer: Self-pay | Admitting: Bariatrics

## 2023-10-18 ENCOUNTER — Ambulatory Visit: Payer: Medicare HMO | Admitting: Bariatrics

## 2023-10-18 VITALS — BP 150/68 | HR 82 | Ht 64.0 in | Wt 159.0 lb

## 2023-10-18 DIAGNOSIS — Z7984 Long term (current) use of oral hypoglycemic drugs: Secondary | ICD-10-CM | POA: Diagnosis not present

## 2023-10-18 DIAGNOSIS — Z6827 Body mass index (BMI) 27.0-27.9, adult: Secondary | ICD-10-CM

## 2023-10-18 DIAGNOSIS — E669 Obesity, unspecified: Secondary | ICD-10-CM | POA: Diagnosis not present

## 2023-10-18 DIAGNOSIS — F5089 Other specified eating disorder: Secondary | ICD-10-CM

## 2023-10-18 DIAGNOSIS — E119 Type 2 diabetes mellitus without complications: Secondary | ICD-10-CM | POA: Diagnosis not present

## 2023-10-18 DIAGNOSIS — E1169 Type 2 diabetes mellitus with other specified complication: Secondary | ICD-10-CM

## 2023-10-18 MED ORDER — TOPIRAMATE 25 MG PO TABS
25.0000 mg | ORAL_TABLET | Freq: Two times a day (BID) | ORAL | 0 refills | Status: DC
Start: 2023-10-18 — End: 2024-07-22

## 2023-10-18 MED ORDER — BUPROPION HCL ER (SR) 200 MG PO TB12
200.0000 mg | ORAL_TABLET | Freq: Every day | ORAL | 0 refills | Status: DC
Start: 2023-10-18 — End: 2024-01-24

## 2023-10-18 NOTE — Progress Notes (Signed)
WEIGHT SUMMARY AND BIOMETRICS  Weight Lost Since Last Visit: 7lb  Weight Gained Since Last Visit: 0   Vitals BP: (!) 150/68 Pulse Rate: 82 SpO2: 97 %   Anthropometric Measurements Height: 5\' 4"  (1.626 m) Weight: 159 lb (72.1 kg) BMI (Calculated): 27.28 Weight at Last Visit: 166lb Weight Lost Since Last Visit: 7lb Weight Gained Since Last Visit: 0 Starting Weight: 239lb Total Weight Loss (lbs): 80 lb (36.3 kg)   Body Composition  Body Fat %: 45.7 % Fat Mass (lbs): 72.8 lbs Muscle Mass (lbs): 82 lbs Total Body Water (lbs): 68.6 lbs Visceral Fat Rating : 12   Other Clinical Data Fasting: no Labs: no Today's Visit #: 23 Starting Date: 09/16/20    OBESITY Rolando is here to discuss her progress with her obesity treatment plan along with follow-up of her obesity related diagnoses.    Nutrition Plan: the Category 2 plan - 35% adherence.  Current exercise: none  Interim History:  She is down 7 lbs since her last visit.  Eating all of the food on the plan., Protein intake is less than prescribed., and Water intake is adequate. She states that her goal is around 150 lbs    Pharmacotherapy: Laureli is on Metformin 500 mg twice daily with meals Adverse side effects: None Hunger is moderately controlled.  Cravings are moderately controlled.  Assessment/Plan:    Eating disorder/emotional eating Enma has had issues with eating occasionally because she likes the taste.  Currently this is moderately controlled. Overall mood is stable. Denies suicidal/homicidal ideation. Medication(s): Topamax 25 mg BID, and Wellbutrin 200 mg daily.   Plan:  Motivational interviewing as well as evidence-based interventions for health behavior change were utilized today including the discussion of self monitoring techniques, problem-solving barriers and SMART goal  setting techniques.  Discussed distractions to curb eating behaviors. Discussed activities to do with one's hands in the evening  Be sure to get adequate rest as lack of rest can trigger appetite.  Have plan in place for stressful events.  She will continue her swimming and will add in some hand and arm weights.   Type II Diabetes HgbA1c is at goal. Last A1c was 5.7 Medication(s): Metformin 500 mg twice daily with meals  Lab Results  Component Value Date   HGBA1C 7.1 (H) 05/11/2020   Lab Results  Component Value Date   LDLCALC 76 05/11/2020   CREATININE 0.78 10/31/2022   No results found for: "GFR"  Plan: Continue Metformin 500 mg twice daily with meals per her PCP.  Will keep all carbohydrates low both sweets and starches.  Will continue exercise regimen to 30 to 60 minutes on most days of the week.  Aim for 7 to 9 hours of sleep nightly.  Eat more low glycemic index foods.    Generalized Obesity: Current BMI BMI (Calculated): 27.28    Mallerie is currently  in the action stage of change. As such, her goal is to maintain weight for now , and /or lose down to around 150 lbs.  She has agreed to the Category 2 plan.  Exercise goals: Older adults should determine their level of effort for physical activity relative to their level of fitness.   Behavioral modification strategies: increasing lean protein intake, decreasing simple carbohydrates , increase water intake, better snacking choices, planning for success, avoiding temptations, and mindful eating.  Crystalyn has agreed to follow-up with our clinic in 6 months.       Objective:   VITALS: Per patient if applicable, see vitals. GENERAL: Alert and in no acute distress. CARDIOPULMONARY: No increased WOB. Speaking in clear sentences.  PSYCH: Pleasant and cooperative. Speech normal rate and rhythm. Affect is appropriate. Insight and judgement are appropriate. Attention is focused, linear, and appropriate.  NEURO: Oriented as  arrived to appointment on time with no prompting.   Attestation Statements:   This was prepared with the assistance of Engineer, civil (consulting).  Occasional wrong-word or sound-a-like substitutions may have occurred due to the inherent limitations of voice recognition   Corinna Capra, DO

## 2023-11-01 DIAGNOSIS — Z6828 Body mass index (BMI) 28.0-28.9, adult: Secondary | ICD-10-CM | POA: Diagnosis not present

## 2023-11-01 DIAGNOSIS — Z1159 Encounter for screening for other viral diseases: Secondary | ICD-10-CM | POA: Diagnosis not present

## 2023-11-01 DIAGNOSIS — Z1331 Encounter for screening for depression: Secondary | ICD-10-CM | POA: Diagnosis not present

## 2023-11-01 DIAGNOSIS — Z23 Encounter for immunization: Secondary | ICD-10-CM | POA: Diagnosis not present

## 2023-11-01 DIAGNOSIS — Z0000 Encounter for general adult medical examination without abnormal findings: Secondary | ICD-10-CM | POA: Diagnosis not present

## 2023-11-01 DIAGNOSIS — Z1211 Encounter for screening for malignant neoplasm of colon: Secondary | ICD-10-CM | POA: Diagnosis not present

## 2023-11-02 ENCOUNTER — Ambulatory Visit: Payer: Medicare HMO | Admitting: Hematology

## 2023-11-02 ENCOUNTER — Other Ambulatory Visit: Payer: Medicare HMO

## 2023-11-07 ENCOUNTER — Other Ambulatory Visit: Payer: Self-pay

## 2023-11-07 DIAGNOSIS — D72820 Lymphocytosis (symptomatic): Secondary | ICD-10-CM

## 2023-11-07 DIAGNOSIS — D509 Iron deficiency anemia, unspecified: Secondary | ICD-10-CM

## 2023-11-08 ENCOUNTER — Inpatient Hospital Stay: Payer: Medicare HMO | Attending: Hematology

## 2023-11-08 ENCOUNTER — Encounter: Payer: Self-pay | Admitting: Hematology

## 2023-11-08 ENCOUNTER — Inpatient Hospital Stay: Payer: Medicare HMO | Admitting: Hematology

## 2023-11-08 VITALS — BP 136/82 | HR 83 | Temp 97.2°F | Resp 16 | Wt 158.6 lb

## 2023-11-08 DIAGNOSIS — E538 Deficiency of other specified B group vitamins: Secondary | ICD-10-CM | POA: Insufficient documentation

## 2023-11-08 DIAGNOSIS — E611 Iron deficiency: Secondary | ICD-10-CM | POA: Insufficient documentation

## 2023-11-08 DIAGNOSIS — Z79899 Other long term (current) drug therapy: Secondary | ICD-10-CM | POA: Diagnosis not present

## 2023-11-08 DIAGNOSIS — D509 Iron deficiency anemia, unspecified: Secondary | ICD-10-CM | POA: Diagnosis not present

## 2023-11-08 DIAGNOSIS — D72820 Lymphocytosis (symptomatic): Secondary | ICD-10-CM | POA: Insufficient documentation

## 2023-11-08 LAB — IRON AND IRON BINDING CAPACITY (CC-WL,HP ONLY)
Iron: 59 ug/dL (ref 28–170)
Saturation Ratios: 15 % (ref 10.4–31.8)
TIBC: 391 ug/dL (ref 250–450)
UIBC: 332 ug/dL (ref 148–442)

## 2023-11-08 LAB — CBC WITH DIFFERENTIAL (CANCER CENTER ONLY)
Abs Immature Granulocytes: 0.02 10*3/uL (ref 0.00–0.07)
Basophils Absolute: 0.1 10*3/uL (ref 0.0–0.1)
Basophils Relative: 1 %
Eosinophils Absolute: 0.5 10*3/uL (ref 0.0–0.5)
Eosinophils Relative: 4 %
HCT: 38.2 % (ref 36.0–46.0)
Hemoglobin: 12.6 g/dL (ref 12.0–15.0)
Immature Granulocytes: 0 %
Lymphocytes Relative: 46 %
Lymphs Abs: 5.8 10*3/uL — ABNORMAL HIGH (ref 0.7–4.0)
MCH: 29.6 pg (ref 26.0–34.0)
MCHC: 33 g/dL (ref 30.0–36.0)
MCV: 89.7 fL (ref 80.0–100.0)
Monocytes Absolute: 0.7 10*3/uL (ref 0.1–1.0)
Monocytes Relative: 6 %
Neutro Abs: 5.3 10*3/uL (ref 1.7–7.7)
Neutrophils Relative %: 43 %
Platelet Count: 209 10*3/uL (ref 150–400)
RBC: 4.26 MIL/uL (ref 3.87–5.11)
RDW: 12.3 % (ref 11.5–15.5)
WBC Count: 12.4 10*3/uL — ABNORMAL HIGH (ref 4.0–10.5)
nRBC: 0 % (ref 0.0–0.2)

## 2023-11-08 LAB — CMP (CANCER CENTER ONLY)
ALT: 11 U/L (ref 0–44)
AST: 13 U/L — ABNORMAL LOW (ref 15–41)
Albumin: 4.3 g/dL (ref 3.5–5.0)
Alkaline Phosphatase: 51 U/L (ref 38–126)
Anion gap: 7 (ref 5–15)
BUN: 18 mg/dL (ref 8–23)
CO2: 27 mmol/L (ref 22–32)
Calcium: 9.6 mg/dL (ref 8.9–10.3)
Chloride: 105 mmol/L (ref 98–111)
Creatinine: 0.69 mg/dL (ref 0.44–1.00)
GFR, Estimated: >60 mL/min (ref >60)
Glucose, Bld: 185 mg/dL — ABNORMAL HIGH (ref 70–99)
Potassium: 3.8 mmol/L (ref 3.5–5.1)
Sodium: 139 mmol/L (ref 135–145)
Total Bilirubin: 0.4 mg/dL (ref 0.0–1.2)
Total Protein: 6.6 g/dL (ref 6.5–8.1)

## 2023-11-08 LAB — VITAMIN B12: Vitamin B-12: 5708 pg/mL — ABNORMAL HIGH (ref 180–914)

## 2023-11-08 LAB — LACTATE DEHYDROGENASE: LDH: 102 U/L (ref 98–192)

## 2023-11-08 LAB — FERRITIN: Ferritin: 15 ng/mL (ref 11–307)

## 2023-11-08 NOTE — Progress Notes (Signed)
 HEMATOLOGY/ONCOLOGY CLINIC NOTE  Date of Service: 11/08/23   Patient Care Team: Okey Carlin Redbird, MD as PCP - General (Family Medicine)  CHIEF COMPLAINTS/PURPOSE OF CONSULTATION:  Follow-up for continued evaluation and management of CLL  HISTORY OF PRESENTING ILLNESS:  Please see previous notes for details on initial presentation  Interval History:   Cassandra Palmer is a wonderful 78 y.o. female who is here for continued evaluation and management of her CLL. She is here for one year follow-up.  Patient was last seen by me on 10/31/2022 and complained of stable consistent back pain, and was otherwise doing well overall.   Today, she reports that she has been doing well overall over the last year. Patient denies any new concerns.   Patient reports several new medication changes and notes that she was started on Ozempic over the summer. She notes that she has lost a couple of pounds since being on Ozempic. Her weight in clinic today is 158 pounds. Patient was previously on Tresiba and her weight had been gradually decreasing. Patient notes that she is planning to come off of Tresiba for at least 6 months. She reports an A1C level of 5.7 recently.   She denies any unexplained fever, chills, night sweats, new lumps/bumps, new bone pains, hip pain, pain along spine on palpation, or abdominal pain. Patient reports stable back pain.   Patient reports that she is currently taking the tablet form of 150 MG ferrex/iron  Polysaccharide. She denies any black stools or blood in stools, but notes dark stools from oral iron . She reports that she has not had a GI workup since her last visit. Patient notes that she will have Cologuard testing today.   Patient reports that her PCP is not in the [facility] system though her health and wellness doctor is.   MEDICAL HISTORY:  Past Medical History:  Diagnosis Date   Anemia    Back pain    CLL (chronic lymphocytic leukemia) (HCC)    Diabetes  mellitus type 2, uncontrolled    Edema of both lower extremities    Hyperlipidemia    Hypertension    Hypothyroidism    Joint pain    Lumbar spondylolysis    Obesity    Sciatic nerve disease    SOB (shortness of breath)    Vitamin D  deficiency     SURGICAL HISTORY: Past Surgical History:  Procedure Laterality Date   ELBOW SURGERY     KNEE SURGERY     TONSILLECTOMY AND ADENOIDECTOMY      SOCIAL HISTORY: Social History   Socioeconomic History   Marital status: Single    Spouse name: Not on file   Number of children: 0   Years of education: college   Highest education level: Not on file  Occupational History   Occupation: Retired  Tobacco Use   Smoking status: Former   Smokeless tobacco: Never  Vaping Use   Vaping status: Never Used  Substance and Sexual Activity   Alcohol use: Yes    Alcohol/week: 1.0 - 2.0 standard drink of alcohol    Types: 1 - 2 Glasses of wine per week    Comment: Occasional   Drug use: No   Sexual activity: Not Currently  Other Topics Concern   Not on file  Social History Narrative   Not on file   Social Drivers of Health   Financial Resource Strain: Not on file  Food Insecurity: No Food Insecurity (07/07/2022)   Hunger Vital Sign  Worried About Programme Researcher, Broadcasting/film/video in the Last Year: Never true    Ran Out of Food in the Last Year: Never true  Transportation Needs: No Transportation Needs (07/07/2022)   PRAPARE - Administrator, Civil Service (Medical): No    Lack of Transportation (Non-Medical): No  Physical Activity: Not on file  Stress: Not on file  Social Connections: Not on file  Intimate Partner Violence: Not on file    FAMILY HISTORY: Family History  Problem Relation Age of Onset   Congestive Heart Failure Mother    Hypertension Mother    Thyroid  disease Mother    Cancer Mother    Anxiety disorder Mother    Diabetes Father    Heart Problems Father    Hyperlipidemia Father    Dementia Father    Thyroid   disease Father    Cancer Father    Congestive Heart Failure Maternal Grandmother    Heart attack Maternal Grandfather    Heart disease Paternal Grandmother     ALLERGIES:  is allergic to invokana [canagliflozin], actos [pioglitazone], and tinactin foot-sneaker deodor [podiatric products].  MEDICATIONS:  Current Outpatient Medications  Medication Sig Dispense Refill   buPROPion  (WELLBUTRIN  SR) 200 MG 12 hr tablet Take 1 tablet (200 mg total) by mouth daily. 90 tablet 0   carvedilol  (COREG ) 3.125 MG tablet Take 1 tablet (3.125 mg total) by mouth 2 (two) times daily. 180 tablet 3   celecoxib (CELEBREX) 200 MG capsule Take 200 mg by mouth daily.     cetirizine (ZYRTEC) 10 MG tablet Take 10 mg by mouth daily.     cyclobenzaprine (FLEXERIL) 10 MG tablet Take 10 mg by mouth daily as needed for muscle spasms.     ezetimibe-simvastatin (VYTORIN) 10-40 MG tablet Take 1 tablet by mouth daily.     furosemide (LASIX) 40 MG tablet Take 40 mg by mouth.     glucose blood test strip 1 each by Other route as needed for other. Use as instructed     insulin  degludec (TRESIBA) 100 UNIT/ML FlexTouch Pen Inject 30-40 Units into the skin daily. Takes 25-32 U     metFORMIN (GLUCOPHAGE) 1000 MG tablet Take 1,000 mg by mouth 2 (two) times daily with a meal.     metroNIDAZOLE (METROCREAM) 0.75 % cream Apply 1 application topically as directed.     montelukast (SINGULAIR) 10 MG tablet Take 10 mg by mouth at bedtime.     niacin (NIASPAN) 1000 MG CR tablet Take 500 mg by mouth at bedtime. 500-100 mg     topiramate  (TOPAMAX ) 25 MG tablet Take 1 tablet (25 mg total) by mouth 2 (two) times daily. 180 tablet 0   traZODone (DESYREL) 100 MG tablet Take 100 mg by mouth at bedtime as needed for sleep.     No current facility-administered medications for this visit.    REVIEW OF SYSTEMS:    10 Point review of Systems was done is negative except as noted above.   PHYSICAL EXAMINATION: .BP 136/82 (BP Location: Left Wrist,  Patient Position: Sitting)   Pulse 83   Temp (!) 97.2 F (36.2 C) (Temporal)   Resp 16   Wt 158 lb 9.6 oz (71.9 kg)   SpO2 100%   BMI 27.22 kg/m   GENERAL:alert, in no acute distress and comfortable SKIN: no acute rashes, no significant lesions EYES: conjunctiva are pink and non-injected, sclera anicteric OROPHARYNX: MMM, no exudates, no oropharyngeal erythema or ulceration NECK: supple, no JVD LYMPH:  no  palpable lymphadenopathy in the cervical, axillary or inguinal regions LUNGS: clear to auscultation b/l with normal respiratory effort HEART: regular rate & rhythm ABDOMEN:  normoactive bowel sounds , non tender, not distended. Extremity: no pedal edema PSYCH: alert & oriented x 3 with fluent speech NEURO: no focal motor/sensory deficits   LABORATORY DATA:  I have reviewed the data as listed  .    Latest Ref Rng & Units 11/08/2023    9:15 AM 10/31/2022   11:14 AM 09/30/2021   10:15 AM  CBC  WBC 4.0 - 10.5 K/uL 12.4  15.8  13.8   Hemoglobin 12.0 - 15.0 g/dL 87.3  86.8  87.4   Hematocrit 36.0 - 46.0 % 38.2  41.0  40.4   Platelets 150 - 400 K/uL 209  272  271    . CBC    Component Value Date/Time   WBC 12.4 (H) 11/08/2023 0915   WBC 13.8 (H) 09/30/2021 1015   RBC 4.26 11/08/2023 0915   HGB 12.6 11/08/2023 0915   HCT 38.2 11/08/2023 0915   HCT 41.9 05/11/2020 1335   PLT 209 11/08/2023 0915   MCV 89.7 11/08/2023 0915   MCH 29.6 11/08/2023 0915   MCHC 33.0 11/08/2023 0915   RDW 12.3 11/08/2023 0915   LYMPHSABS 5.8 (H) 11/08/2023 0915   MONOABS 0.7 11/08/2023 0915   EOSABS 0.5 11/08/2023 0915   BASOSABS 0.1 11/08/2023 0915     .    Latest Ref Rng & Units 11/08/2023    9:15 AM 10/31/2022   11:14 AM 09/30/2021   10:15 AM  CMP  Glucose 70 - 99 mg/dL 814  777  886   BUN 8 - 23 mg/dL 18  17  19    Creatinine 0.44 - 1.00 mg/dL 9.30  9.21  9.18   Sodium 135 - 145 mmol/L 139  137  139   Potassium 3.5 - 5.1 mmol/L 3.8  3.5  4.3   Chloride 98 - 111 mmol/L 105  99  107    CO2 22 - 32 mmol/L 27  31  23    Calcium 8.9 - 10.3 mg/dL 9.6  9.9  9.7   Total Protein 6.5 - 8.1 g/dL 6.6  6.9  7.0   Total Bilirubin 0.0 - 1.2 mg/dL 0.4  0.4  0.4   Alkaline Phos 38 - 126 U/L 51  73  63   AST 15 - 41 U/L 13  29  14    ALT 0 - 44 U/L 11  55  13    . Lab Results  Component Value Date   LDH 102 11/08/2023    . Lab Results  Component Value Date   IRON  59 11/08/2023   TIBC 391 11/08/2023   IRONPCTSAT 15 11/08/2023   (Iron  and TIBC)  Lab Results  Component Value Date   FERRITIN 15 11/08/2023   B12 - 292---> 584  Component     Latest Ref Rng & Units 08/05/2018  Hep B Core Ab, Tot     Negative Negative  Hepatitis B Surface Ag     Negative Negative  HCV Ab     0.0 - 0.9 s/co ratio <0.1  LDH     98 - 192 U/L 175   09/09/18 CLL FISH:      07/03/18 CBC w/diff:    RADIOGRAPHIC STUDIES: I have personally reviewed the radiological images as listed and agreed with the findings in the report. No results found.   ASSESSMENT & PLAN:  78 y.o.  female with  1.  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  08/05/18 Peripheral Blood Flow cytometry revealed CD5 positive monoclonal B-cell population at comprising 49% of total lymphocytes  08/05/18 Hep B and Hep C serologies neg   09/09/18 FISH CLL Prognostic panel revealed 17% of cells with mono-allelic 13q deletion, 34% of cells with bi-allelic 13q deletion, and 49% cells with normal nuclei.   09/18/18 PET/CT revealed Focal hypermetabolism in the proximal right femoral metaphysis without discrete bone lesion on the CT images, suspicious for osseous lymphoma. 2. No additional metabolic evidence of lymphoma. No hypermetabolic lymphadenopathy. Normal size non-hypermetabolic spleen. 3. Few scattered solid pulmonary nodules, largest 7 mm, below PET resolution. Recommend attention on follow-up chest CT in 3-6 months. 4. Chronic findings include: Aortic  Atherosclerosis. Coronary atherosclerosis. Small hiatal hernia. Moderate colonic diverticulosis. Cholelithiasis. Myomatous uterus.   10/12/18 MRI Femur Right revealed Signal in the region of increased activity on PET scan correlates with red marrow but is subtly more focal and decreased on T1 weighted imaging compared to other imaged bones. Given activity on PET, the finding is worrisome for focal lymphoma involvement.  07/15/2019 CT chest wo contrast revealed 1. Multiple small pulmonary nodules measuring 3-4 mm in size, nonspecific, but favored to be benign. No follow-up needed if patient is low-risk (and has no known or suspected primary neoplasm). Non-contrast chest CT can be considered in 12 months if patient is high-risk. This recommendation follows the consensus statement: Guidelines for Management of Incidental Pulmonary NodulesDetected on CT Images: From the Fleischner Society 2017; Radiology 2017; 284:228-243. 2. Aortic atherosclerosis, in addition to left anterior descending coronary artery disease. Please note that although the presence of coronary artery calcium documents the presence of coronary artery disease, the severity of this disease and any potential stenosis cannot be assessed on this non-gated CT examination. Assessment for potential risk factor modification, dietary therapy or pharmacologic therapy may be warranted, if clinically indicated. 3. Small hiatal hernia.  07/15/2019 DG femur which revealed Faint lucency in the proximal right femoral shaft which corresponds to the lesion seen on prior MRI and PET-CT.  #2 RBC microcytosis without anemia related to iron  deficiency She has been on chronic NSAIDs with the risk of NSAID related gastropathy and bleeding. . Lab Results  Component Value Date   IRON  66 10/31/2022   TIBC 480 (H) 10/31/2022   IRONPCTSAT 14 10/31/2022   (Iron  and TIBC)  Lab Results  Component Value Date   FERRITIN 10 (L) 10/31/2022   #3 vitamin B12  deficiency Could be an absorption issue or could be from metformin B12 levels are improved to 584  PLAN:  -Discussed lab results on 11/08/23  in detail with patient. CBC normal, showed WBC of 12.4K, hemoglobin of 12.6, and platelets of 209K. -Her WBC was previously 15.8K in January 2024 and has since improved to 12.4K.  -CMP shows blood sugar level of 185, otherwise stable -iron  labs pending at time of visit, will order oral iron  prescription once findings result -Continue vitamin B-12 supplement.  B-12 lab pending at time of visit.  -patient did not appear to have an enlarged spleen based on physical examination -patient has no lab evidence or clinical sign of overt monoclonal B lymphocytosis/CLL progression at this time -patient was noted to have degenerative changes on her last CT scan in 2022 causing her stable back pain -recommend patient to follow-up with her PCP for consideration of referral to gastroenterology for GI  workup for her iron  deficiency. -answered all of patient's questions in detail  FOLLOW-UP: RTC with Dr Onesimo with labs in 12 months  The total time spent in the appointment was 20 minutes* .  All of the patient's questions were answered with apparent satisfaction. The patient knows to call the clinic with any problems, questions or concerns.   Emaline Onesimo MD MS AAHIVMS [facility] Hematology/Oncology Physician [facility]  .*Total Encounter Time as defined by the Centers for Medicare and Medicaid Services includes, in addition to the face-to-face time of a patient visit (documented in the note above) non-face-to-face time: obtaining and reviewing outside history, ordering and reviewing medications, tests or procedures, care coordination (communications with other health care professionals or caregivers) and documentation in the medical record.    I,Mitra Faeizi,acting as a neurosurgeon for Emaline Onesimo, MD.,have documented all relevant documentation on the  behalf of Emaline Onesimo, MD,as directed by  Emaline Onesimo, MD while in the presence of Emaline Onesimo, MD.  .I have reviewed the above documentation for accuracy and completeness, and I agree with the above. .Kobie Whidby Kishore Adeli Frost MD

## 2023-11-14 MED ORDER — POLYSACCHARIDE IRON COMPLEX 150 MG PO CAPS: 150.0000 mg | ORAL_CAPSULE | Freq: Every day | ORAL | 5 refills | Status: AC

## 2023-11-22 DIAGNOSIS — Z1211 Encounter for screening for malignant neoplasm of colon: Secondary | ICD-10-CM | POA: Diagnosis not present

## 2023-11-22 DIAGNOSIS — Z1212 Encounter for screening for malignant neoplasm of rectum: Secondary | ICD-10-CM | POA: Diagnosis not present

## 2023-12-26 DIAGNOSIS — Z1231 Encounter for screening mammogram for malignant neoplasm of breast: Secondary | ICD-10-CM | POA: Diagnosis not present

## 2024-01-24 ENCOUNTER — Encounter: Payer: Self-pay | Admitting: Bariatrics

## 2024-01-24 ENCOUNTER — Other Ambulatory Visit: Payer: Self-pay | Admitting: Bariatrics

## 2024-01-24 DIAGNOSIS — F5089 Other specified eating disorder: Secondary | ICD-10-CM

## 2024-02-06 ENCOUNTER — Other Ambulatory Visit: Payer: Self-pay

## 2024-02-06 DIAGNOSIS — Z794 Long term (current) use of insulin: Secondary | ICD-10-CM | POA: Insufficient documentation

## 2024-02-06 DIAGNOSIS — R1013 Epigastric pain: Secondary | ICD-10-CM | POA: Diagnosis not present

## 2024-02-06 DIAGNOSIS — R1012 Left upper quadrant pain: Secondary | ICD-10-CM | POA: Diagnosis not present

## 2024-02-06 DIAGNOSIS — D72829 Elevated white blood cell count, unspecified: Secondary | ICD-10-CM | POA: Insufficient documentation

## 2024-02-06 DIAGNOSIS — R197 Diarrhea, unspecified: Secondary | ICD-10-CM | POA: Diagnosis not present

## 2024-02-06 DIAGNOSIS — R1010 Upper abdominal pain, unspecified: Secondary | ICD-10-CM | POA: Diagnosis present

## 2024-02-06 DIAGNOSIS — E119 Type 2 diabetes mellitus without complications: Secondary | ICD-10-CM | POA: Diagnosis not present

## 2024-02-06 DIAGNOSIS — R112 Nausea with vomiting, unspecified: Secondary | ICD-10-CM | POA: Diagnosis not present

## 2024-02-06 DIAGNOSIS — Z7984 Long term (current) use of oral hypoglycemic drugs: Secondary | ICD-10-CM | POA: Insufficient documentation

## 2024-02-06 LAB — CBC
HCT: 41.6 % (ref 36.0–46.0)
Hemoglobin: 13.9 g/dL (ref 12.0–15.0)
MCH: 29.5 pg (ref 26.0–34.0)
MCHC: 33.4 g/dL (ref 30.0–36.0)
MCV: 88.3 fL (ref 80.0–100.0)
Platelets: 310 10*3/uL (ref 150–400)
RBC: 4.71 MIL/uL (ref 3.87–5.11)
RDW: 13.4 % (ref 11.5–15.5)
WBC: 32.4 10*3/uL — ABNORMAL HIGH (ref 4.0–10.5)
nRBC: 0 % (ref 0.0–0.2)

## 2024-02-06 LAB — COMPREHENSIVE METABOLIC PANEL WITH GFR
ALT: 14 U/L (ref 0–44)
AST: 15 U/L (ref 15–41)
Albumin: 4.3 g/dL (ref 3.5–5.0)
Alkaline Phosphatase: 40 U/L (ref 38–126)
Anion gap: 15 (ref 5–15)
BUN: 29 mg/dL — ABNORMAL HIGH (ref 8–23)
CO2: 22 mmol/L (ref 22–32)
Calcium: 9.4 mg/dL (ref 8.9–10.3)
Chloride: 99 mmol/L (ref 98–111)
Creatinine, Ser: 0.69 mg/dL (ref 0.44–1.00)
GFR, Estimated: >60 mL/min (ref >60)
Glucose, Bld: 193 mg/dL — ABNORMAL HIGH (ref 70–99)
Potassium: 3.9 mmol/L (ref 3.5–5.1)
Sodium: 136 mmol/L (ref 135–145)
Total Bilirubin: 0.6 mg/dL (ref 0.0–1.2)
Total Protein: 6.5 g/dL (ref 6.5–8.1)

## 2024-02-06 LAB — URINALYSIS, ROUTINE W REFLEX MICROSCOPIC
Bacteria, UA: NONE SEEN
Glucose, UA: NEGATIVE mg/dL
Ketones, ur: 40 mg/dL — AB
Nitrite: NEGATIVE
Protein, ur: 30 mg/dL — AB
Specific Gravity, Urine: 1.045 — ABNORMAL HIGH (ref 1.005–1.030)
pH: 6 (ref 5.0–8.0)

## 2024-02-06 LAB — LIPASE, BLOOD: Lipase: 12 U/L (ref 11–51)

## 2024-02-06 LAB — CBG MONITORING, ED: Glucose-Capillary: 188 mg/dL — ABNORMAL HIGH (ref 70–99)

## 2024-02-06 MED ORDER — ONDANSETRON HCL 4 MG/2ML IJ SOLN
4.0000 mg | Freq: Once | INTRAMUSCULAR | Status: AC | PRN
  Administered 2024-02-06: 4 mg via INTRAVENOUS
  Filled 2024-02-06: qty 2

## 2024-02-06 NOTE — ED Triage Notes (Signed)
 Last night thought she had food poisoning- emesis. Diabetic. Stool appears dark- takes iron. Out of breath from abd pain.

## 2024-02-07 ENCOUNTER — Emergency Department (HOSPITAL_BASED_OUTPATIENT_CLINIC_OR_DEPARTMENT_OTHER)
Admission: EM | Admit: 2024-02-07 | Discharge: 2024-02-07 | Disposition: A | Discharge status: Home / Self Care | Attending: Emergency Medicine | Admitting: Emergency Medicine

## 2024-02-07 ENCOUNTER — Emergency Department (HOSPITAL_BASED_OUTPATIENT_CLINIC_OR_DEPARTMENT_OTHER)

## 2024-02-07 DIAGNOSIS — R188 Other ascites: Secondary | ICD-10-CM | POA: Diagnosis not present

## 2024-02-07 DIAGNOSIS — K529 Noninfective gastroenteritis and colitis, unspecified: Secondary | ICD-10-CM | POA: Diagnosis not present

## 2024-02-07 DIAGNOSIS — K8020 Calculus of gallbladder without cholecystitis without obstruction: Secondary | ICD-10-CM | POA: Diagnosis not present

## 2024-02-07 DIAGNOSIS — R112 Nausea with vomiting, unspecified: Secondary | ICD-10-CM

## 2024-02-07 DIAGNOSIS — K5730 Diverticulosis of large intestine without perforation or abscess without bleeding: Secondary | ICD-10-CM | POA: Diagnosis not present

## 2024-02-07 MED ORDER — ONDANSETRON 4 MG PO TBDP
ORAL_TABLET | ORAL | 0 refills | Status: DC
Start: 2024-02-07 — End: 2024-04-17

## 2024-02-07 MED ORDER — LOPERAMIDE HCL 2 MG PO CAPS
4.0000 mg | ORAL_CAPSULE | Freq: Once | ORAL | Status: AC
  Administered 2024-02-07: 4 mg via ORAL
  Filled 2024-02-07: qty 2

## 2024-02-07 MED ORDER — IOHEXOL 300 MG/ML  SOLN
100.0000 mL | Freq: Once | INTRAMUSCULAR | Status: AC | PRN
  Administered 2024-02-07: 100 mL via INTRAVENOUS

## 2024-02-07 MED ORDER — MORPHINE SULFATE (PF) 4 MG/ML IV SOLN
4.0000 mg | Freq: Once | INTRAVENOUS | Status: AC
  Administered 2024-02-07: 4 mg via INTRAVENOUS
  Filled 2024-02-07: qty 1

## 2024-02-07 MED ORDER — ONDANSETRON HCL 4 MG/2ML IJ SOLN
4.0000 mg | Freq: Once | INTRAMUSCULAR | Status: AC
  Administered 2024-02-07: 4 mg via INTRAVENOUS
  Filled 2024-02-07: qty 2

## 2024-02-07 MED ORDER — SODIUM CHLORIDE 0.9 % IV BOLUS
1000.0000 mL | Freq: Once | INTRAVENOUS | Status: AC
  Administered 2024-02-07: 1000 mL via INTRAVENOUS

## 2024-02-07 NOTE — Discharge Instructions (Addendum)
 Follow up with your family doc in the office.  Please return for worsening pain inability eat or drink.  I prescribed you medication for nausea.  You can take Imodium for diarrhea.  This is over-the-counter.

## 2024-02-07 NOTE — ED Provider Notes (Signed)
 [place] EMERGENCY DEPARTMENT AT [facility] Provider Note   CSN: [id number] Arrival date & time: 02/06/24  1953     History  Chief Complaint  Patient presents with   Abdominal Pain    Cassandra Palmer is a 78 y.o. female.  78 yo  F with a chief complaints of upper abdominal discomfort.  Patient has had nausea vomiting and diarrhea for about 24 hours now.  She thought it was due to maybe some salmon that she that was leftover from the meal before.  No other known sick contacts.  No fevers.  She has had some dark stool which she was worried may be due to GI bleeding.  Has a history of an upper GI bleed in the past.  Chronically on iron so her stools are always dark.   Abdominal Pain      Home Medications Prior to Admission medications   Medication Sig Start Date End Date Taking? Authorizing Provider  ondansetron (ZOFRAN-ODT) 4 MG disintegrating tablet 4mg  ODT q4 hours prn nausea/vomit 02/07/24  Yes Melene Plan, DO  buPROPion [facility] SR) 200 MG 12 hr tablet TAKE 1 TABLET BY MOUTH EVERY DAY 01/24/24   Corinna Capra A, DO  carvedilol (COREG) 3.125 MG tablet Take 1 tablet (3.125 mg total) by mouth 2 (two) times daily. 10/13/16 09/30/21  Nahser, Deloris Ping, MD  celecoxib (CELEBREX) 200 MG capsule Take 200 mg by mouth daily.    [provider]  cetirizine (ZYRTEC) 10 MG tablet Take 10 mg by mouth daily.    [provider]  cyclobenzaprine (FLEXERIL) 10 MG tablet Take 10 mg by mouth daily as needed for muscle spasms.    [provider]  ezetimibe-simvastatin (VYTORIN) 10-40 MG tablet Take 1 tablet by mouth daily.    [provider]  furosemide (LASIX) 40 MG tablet Take 40 mg by mouth.    [provider]  glucose blood test strip 1 each by Other route as needed for other. Use as instructed    [provider]  insulin degludec (TRESIBA) 100 UNIT/ML FlexTouch Pen Inject 30-40 Units into the skin daily. Takes 25-32 U     [provider]  iron polysaccharides (NIFEREX) 150 MG capsule Take 1 capsule (150 mg total) by mouth daily. 11/14/23   Johney Maine, MD  metFORMIN (GLUCOPHAGE) 1000 MG tablet Take 1,000 mg by mouth 2 (two) times daily with a meal.    [provider]  metroNIDAZOLE (METROCREAM) 0.75 % cream Apply 1 application topically as directed.    [provider]  montelukast (SINGULAIR) 10 MG tablet Take 10 mg by mouth at bedtime.    [provider]  niacin (NIASPAN) 1000 MG CR tablet Take 500 mg by mouth at bedtime. 500-100 mg    [provider]  topiramate (TOPAMAX) 25 MG tablet Take 1 tablet (25 mg total) by mouth 2 (two) times daily. 10/18/23   Corinna Capra A, DO  traZODone (DESYREL) 100 MG tablet Take 100 mg by mouth at bedtime as needed for sleep.    [provider]      Allergies    Invokana [canagliflozin], Actos [pioglitazone], and Tinactin foot-sneaker deodor [podiatric products]    Review of Systems   Review of Systems  Gastrointestinal:  Positive for abdominal pain.    Physical Exam Updated Vital Signs BP (!) 144/54   Pulse 86   Temp (!) 97.4 F (36.3 C) (Oral)   Resp 16   SpO2 98%  Physical Exam  Vitals and nursing note reviewed.  Constitutional:      General: She is not in acute distress.    Appearance: She is well-developed. She is not diaphoretic.  HENT:     Head: Normocephalic and atraumatic.  Eyes:     Pupils: Pupils are equal, round, and reactive to light.  Cardiovascular:     Rate and Rhythm: Normal rate and regular rhythm.     Heart sounds: No murmur heard.    No friction rub. No gallop.  Pulmonary:     Effort: Pulmonary effort is normal.     Breath sounds: No wheezing or rales.  Abdominal:     General: There is no distension.     Palpations: Abdomen is soft.     Tenderness: There is abdominal tenderness (pain to epigastrium and LUQ).  Musculoskeletal:        General: No tenderness.     Cervical  back: Normal range of motion and neck supple.  Skin:    General: Skin is warm and dry.  Neurological:     Mental Status: She is alert and oriented to person, place, and time.  Psychiatric:        Behavior: Behavior normal.     ED Results / Procedures / Treatments   Labs (all labs ordered are listed, but only abnormal results are displayed) Labs Reviewed  COMPREHENSIVE METABOLIC PANEL WITH GFR - Abnormal; Notable for the following components:      Result Value   Glucose, Bld 193 (*)    BUN 29 (*)    All other components within normal limits  CBC - Abnormal; Notable for the following components:   WBC 32.4 (*)    All other components within normal limits  URINALYSIS, ROUTINE W REFLEX MICROSCOPIC - Abnormal; Notable for the following components:   Specific Gravity, Urine 1.045 (*)    Hgb urine dipstick SMALL (*)    Bilirubin Urine SMALL (*)    Ketones, ur 40 (*)    Protein, ur 30 (*)    Leukocytes,Ua TRACE (*)    All other components within normal limits  CBG MONITORING, ED - Abnormal; Notable for the following components:   Glucose-Capillary 188 (*)    All other components within normal limits  LIPASE, BLOOD    EKG None  Radiology CT ABDOMEN PELVIS W CONTRAST Result Date: 02/07/2024 CLINICAL DATA:  Acute, nonlocalized abdominal pain EXAM: CT ABDOMEN AND PELVIS WITH CONTRAST TECHNIQUE: Multidetector CT imaging of the abdomen and pelvis was performed using the standard protocol following bolus administration of intravenous contrast. RADIATION DOSE REDUCTION: This exam was performed according to the departmental dose-optimization program which includes automated exposure control, adjustment of the mA and/or kV according to patient size and/or use of iterative reconstruction technique. CONTRAST:  OMNIPAQUE IOHEXOL 300 MG/ML  SOLN COMPARISON:  10/11/2021 FINDINGS: Lower chest:  No contributory findings. Hepatobiliary: Probable liver perfusion anomaly at the gallbladder fossa.  Small low densities scattered in the liver, most too small for accurate densitometry, stable simple cyst in the lateral left lobe. Nodular and cystic thickening of the anterior wall gallbladder which is unchanged and likely focal adenomyomatosis. Cholelithiasis, no evidence of gallbladder wall edema or bile duct dilatation. Pancreas: Generalized atrophy. Spleen: Unremarkable. Adrenals/Urinary Tract: Negative adrenals. No hydronephrosis or stone. Small cysts in the right renal cortex the upper pole. Unremarkable bladder. Stomach/Bowel: Segment of thickened small bowel with variable submucosal low-density edema, mesenteric edema, and hypervascular mildly enlarged mesenteric lymph nodes. No perforation or obstruction. Adjacent  major mesenteric vessels are enhancing. Distal colonic diverticulosis. Vascular/Lymphatic: No acute vascular abnormality. Atheromatous calcification of the aorta and iliacs. Reproductive:Partially calcified fibroid measuring nearly 4 cm Other: Small volume ascites mainly in the pelvis and around the liver and spleen. Calcified peritoneal mass in the low peritoneal space. Musculoskeletal: No acute abnormalities. Generalized lower thoracic and lumbar spine degeneration with mild L3-4 anterolisthesis. IMPRESSION: Loops of enteritis in the low abdomen with reactive ascites. Atherosclerosis, colonic diverticulosis, and cholelithiasis. Electronically Signed   By: Tiburcio Pea M.D.   On: 02/07/2024 04:09    Procedures Procedures    Medications Ordered in ED Medications  ondansetron [facility]) injection 4 mg (4 mg Intravenous Given 02/06/24 2107)  sodium chloride 0.9 % bolus 1,000 mL (1,000 mLs Intravenous New Bag/Given 02/07/24 0216)  ondansetron (ZOFRAN) injection 4 mg (4 mg Intravenous Given 02/07/24 0217)  loperamide (IMODIUM) capsule 4 mg (4 mg Oral Given 02/07/24 0216)  morphine (PF) 4 MG/ML injection 4 mg (4 mg Intravenous Given 02/07/24 0218)  iohexol (OMNIPAQUE) 300 MG/ML solution 100  mL (100 mLs Intravenous Contrast Given 02/07/24 0134)    ED Course/ Medical Decision Making/ A&P                                 Medical Decision Making Amount and/or Complexity of Data Reviewed Labs: ordered. Radiology: ordered.  Risk Prescription drug management.   78 yo F with a chief complaints of upper abdominal pain after about 24 hours of nausea and vomiting.  Significant leukocytosis here.  LFTs and lipase are unremarkable.  She was concerned of the possibility of an upper GI bleed though her hemoglobin is stable.  Will give a bolus of IV fluids.  CT imaging pain and nausea medicine and reassess.  CT scan with likely enteritis.  Patient feeling much better on repeat assessment.  Will discharge home.  PCP follow-up.  5:27 AM:  I have discussed the diagnosis/risks/treatment options with the patient.  Evaluation and diagnostic testing in the emergency department does not suggest an emergent condition requiring admission or immediate intervention beyond what has been performed at this time.  They will follow up with PCP. We also discussed returning to the ED immediately if new or worsening sx occur. We discussed the sx which are most concerning (e.g., sudden worsening pain, fever, inability to tolerate by mouth) that necessitate immediate return. Medications administered to the patient during their visit and any new prescriptions provided to the patient are listed below.  Medications given during this visit Medications  ondansetron (ZOFRAN) injection 4 mg (4 mg Intravenous Given 02/06/24 2107)  sodium chloride 0.9 % bolus 1,000 mL (1,000 mLs Intravenous New Bag/Given 02/07/24 0216)  ondansetron (ZOFRAN) injection 4 mg (4 mg Intravenous Given 02/07/24 0217)  loperamide (IMODIUM) capsule 4 mg (4 mg Oral Given 02/07/24 0216)  morphine (PF) 4 MG/ML injection 4 mg (4 mg Intravenous Given 02/07/24 0218)  iohexol (OMNIPAQUE) 300 MG/ML solution 100 mL (100 mLs Intravenous Contrast Given 02/07/24  0134)     The patient appears reasonably screen and/or stabilized for discharge and I doubt any other medical condition or other [facility] requiring further screening, evaluation, or treatment in the ED at this time prior to discharge.         Final Clinical Impression(s) / ED Diagnoses Final diagnoses:  Nausea vomiting and diarrhea    Rx / DC Orders ED Discharge Orders  Ordered    ondansetron (ZOFRAN-ODT) 4 MG disintegrating tablet        02/07/24 0524              Melene Plan, DO 02/07/24 [phone]

## 2024-03-07 DIAGNOSIS — Z961 Presence of intraocular lens: Secondary | ICD-10-CM | POA: Diagnosis not present

## 2024-03-07 DIAGNOSIS — H26491 Other secondary cataract, right eye: Secondary | ICD-10-CM | POA: Diagnosis not present

## 2024-03-07 DIAGNOSIS — H04123 Dry eye syndrome of bilateral lacrimal glands: Secondary | ICD-10-CM | POA: Diagnosis not present

## 2024-03-07 DIAGNOSIS — H5212 Myopia, left eye: Secondary | ICD-10-CM | POA: Diagnosis not present

## 2024-03-07 DIAGNOSIS — H524 Presbyopia: Secondary | ICD-10-CM | POA: Diagnosis not present

## 2024-04-02 DIAGNOSIS — D225 Melanocytic nevi of trunk: Secondary | ICD-10-CM | POA: Diagnosis not present

## 2024-04-02 DIAGNOSIS — L821 Other seborrheic keratosis: Secondary | ICD-10-CM | POA: Diagnosis not present

## 2024-04-02 DIAGNOSIS — L820 Inflamed seborrheic keratosis: Secondary | ICD-10-CM | POA: Diagnosis not present

## 2024-04-02 DIAGNOSIS — L7211 Pilar cyst: Secondary | ICD-10-CM | POA: Diagnosis not present

## 2024-04-02 DIAGNOSIS — L538 Other specified erythematous conditions: Secondary | ICD-10-CM | POA: Diagnosis not present

## 2024-04-02 DIAGNOSIS — L814 Other melanin hyperpigmentation: Secondary | ICD-10-CM | POA: Diagnosis not present

## 2024-04-14 DIAGNOSIS — R634 Abnormal weight loss: Secondary | ICD-10-CM | POA: Diagnosis not present

## 2024-04-14 DIAGNOSIS — I10 Essential (primary) hypertension: Secondary | ICD-10-CM | POA: Diagnosis not present

## 2024-04-14 DIAGNOSIS — E1169 Type 2 diabetes mellitus with other specified complication: Secondary | ICD-10-CM | POA: Diagnosis not present

## 2024-04-14 DIAGNOSIS — I959 Hypotension, unspecified: Secondary | ICD-10-CM | POA: Diagnosis not present

## 2024-04-14 DIAGNOSIS — Z6825 Body mass index (BMI) 25.0-25.9, adult: Secondary | ICD-10-CM | POA: Diagnosis not present

## 2024-04-17 ENCOUNTER — Ambulatory Visit: Payer: Medicare HMO | Admitting: Bariatrics

## 2024-04-17 ENCOUNTER — Encounter: Payer: Self-pay | Admitting: Bariatrics

## 2024-04-17 DIAGNOSIS — E669 Obesity, unspecified: Secondary | ICD-10-CM | POA: Diagnosis not present

## 2024-04-17 DIAGNOSIS — F5089 Other specified eating disorder: Secondary | ICD-10-CM | POA: Diagnosis not present

## 2024-04-17 DIAGNOSIS — Z6824 Body mass index (BMI) 24.0-24.9, adult: Secondary | ICD-10-CM

## 2024-04-17 DIAGNOSIS — E119 Type 2 diabetes mellitus without complications: Secondary | ICD-10-CM

## 2024-04-17 DIAGNOSIS — Z7984 Long term (current) use of oral hypoglycemic drugs: Secondary | ICD-10-CM | POA: Diagnosis not present

## 2024-04-17 DIAGNOSIS — Z7985 Long-term (current) use of injectable non-insulin antidiabetic drugs: Secondary | ICD-10-CM

## 2024-04-17 MED ORDER — BUPROPION HCL ER (SR) 200 MG PO TB12: 200.0000 mg | ORAL_TABLET | Freq: Every day | ORAL | 0 refills | Status: DC

## 2024-04-17 NOTE — Progress Notes (Deleted)
                                                                                                              WEIGHT SUMMARY AND BIOMETRICS  No data recorded No data recorded  No data recorded No data recorded No data recorded No data recorded  OBESITY Cassandra Palmer is here to discuss her progress with her obesity treatment plan along with follow-up of her obesity related diagnoses.    Nutrition Plan: the Category 3 plan - ***% adherence.  Current exercise: {exercise types:16438}  Interim History:  *** {aabnutritionassessment:29213}   Pharmacotherapy: Cassandra Palmer is on {dwwpharmacotherapy:29109} Adverse side effects: {dwwse:29122} Hunger is {EWCONTROLASSESSMENT:24261}.  Cravings are {EWCONTROLASSESSMENT:24261}.  Assessment/Plan:   There are no diagnoses linked to this encounter.    {dwwmorbid:29108::Morbid Obesity}: Current BMI No data recorded  Pharmacotherapy Plan {dwwmed:29123}  {dwwpharmacotherapy:29109}  Cassandra Palmer {CHL AMB IS/IS NOT:210130109} currently in the action stage of change. As such, her goal is to {MWMwtloss#1:210800005}.  She has agreed to {dwwsldiets:29085}.  Exercise goals: {MWM EXERCISE RECS:23473}  Behavioral modification strategies: {dwwslwtlossstrategies:29088}.  Cassandra Palmer has agreed to follow-up with our clinic in {NUMBER 1-10:22536} weeks.   No orders of the defined types were placed in this encounter.   There are no discontinued medications.   No orders of the defined types were placed in this encounter.     Objective:   VITALS: Per patient if applicable, see vitals. GENERAL: Alert and in no acute distress. CARDIOPULMONARY: No increased WOB. Speaking in clear sentences.  PSYCH: Pleasant and cooperative. Speech normal rate and rhythm. Affect is appropriate. Insight and judgement are appropriate. Attention is focused, linear, and appropriate.  NEURO: Oriented as arrived to appointment on time with no prompting.   Attestation Statements:    This was prepared with the assistance of Engineer, civil (consulting).  Occasional wrong-word or sound-a-like substitutions may have occurred due to the inherent limitations of voice recognition

## 2024-04-17 NOTE — Progress Notes (Signed)
 WEIGHT SUMMARY AND BIOMETRICS  Weight Lost Since Last Visit: 18lb  Weight Gained Since Last Visit: 0   Vitals BP: 117/76 Pulse Rate: 98 SpO2: 99 %   Anthropometric Measurements Height: 5' 4 (1.626 m) Weight: 141 lb (64 kg) BMI (Calculated): 24.19 Weight at Last Visit: 293lb Weight Lost Since Last Visit: 18lb Weight Gained Since Last Visit: 0 Starting Weight: 305lb Total Weight Loss (lbs): 98 lb (44.5 kg)   Body Composition  Body Fat %: 42.3 % Fat Mass (lbs): 59.8 lbs Muscle Mass (lbs): 77.4 lbs Total Body Water (lbs): 63.6 lbs Visceral Fat Rating : 11   Other Clinical Data Fasting: no Labs: no Today's Visit #: 24 Starting Date: 09/16/20    OBESITY Cassandra Palmer is here to discuss her progress with her obesity treatment plan along with follow-up of her obesity related diagnoses.    Nutrition Plan: the Category 2 plan - 30% adherence.  Current exercise: water aerobics  Interim History:  She is down 18 lbs since her last visit. She is working with her provider to lower her insulin .  She has now stopped insulin  altogether but is still taking Ozempic. Protein intake is less than prescribed., Is not skipping meals, and Water intake is adequate.   Pharmacotherapy: Cassandra Palmer is on Ozempic and Metformin. Adverse side effects: None Hunger is moderately controlled.  Cravings are moderately controlled.  Assessment/Plan:   1. Other disorder of eating  Cassandra Palmer has had issues with boredom eating. Currently this is moderately controlled. Overall mood is stable. Denies suicidal/homicidal ideation. Medication(s): Wellbutrin  200 mg daily in the am  Plan:  Motivational interviewing as well as evidence-based interventions for health behavior change were utilized today including the discussion of self monitoring techniques, problem-solving barriers and SMART goal  setting techniques.  Consider a referral to Dr. Delaine Favorite, PhD psychologist to help with eating behaviors.  Discussed distractions to curb eating behaviors. Discussed activities to do with one's hands in the evening  Be sure to get adequate rest as lack of rest can trigger appetite.  Have plan in place for stressful events.  Consider other rewards besides food.     Type II Diabetes:  HgbA1c is at goal. Last A1c was 6.4 CBGs: Fasting usually around 120      Episodes of hypoglycemia: no Medication(s): Metformin and Ozempic  Lab Results  Component Value Date   HGBA1C 7.1 (H) 05/11/2020   Lab Results  Component Value Date   LDLCALC 76 05/11/2020   CREATININE 0.69 02/06/2024    Plan: Continue all other medications.  Will keep all carbohydrates low both sweets and starches.  Will continue exercise regimen to 30 to 60 minutes on most days of the week.  Aim for 7 to 9 hours of sleep nightly.  Eat more low glycemic index foods.     History of Generalized Obesity: Current BMI BMI (Calculated): 24.19  Pharmacotherapy Plan She will continue her medication regiment per her provider.   Cassandra Palmer is currently in the action stage of change. As such, her goal is to maintain weight for now.  She has agreed to the Category 2 plan.  Exercise goals: Older adults should follow the adult guidelines. When older adults cannot meet the adult guidelines, they should be as physically active as their abilities and conditions will allow.  She should continue her Aquaphor size and will add in some weight training for resistance..   Behavioral modification strategies: increasing lean protein intake, decreasing simple carbohydrates , no meal skipping, meal planning , increase water intake, planning for success, keep healthy foods in the home, and weigh protein portions.  [name] has agreed to follow-up with our clinic in 6 months.   Objective:   VITALS: Per patient if applicable, see vitals. GENERAL:  Alert and in no acute distress. CARDIOPULMONARY: No increased WOB. Speaking in clear sentences.  PSYCH: Pleasant and cooperative. Speech normal rate and rhythm. Affect is appropriate. Insight and judgement are appropriate. Attention is focused, linear, and appropriate.  NEURO: Oriented as arrived to appointment on time with no prompting.   Attestation Statements:   This was prepared with the assistance of Engineer, civil (consulting).  Occasional wrong-word or sound-a-like substitutions may have occurred due to the inherent limitations of voice recognition   Kirk Peper, DO

## 2024-04-18 DIAGNOSIS — H26491 Other secondary cataract, right eye: Secondary | ICD-10-CM | POA: Diagnosis not present

## 2024-04-21 ENCOUNTER — Encounter: Payer: Self-pay | Admitting: Bariatrics

## 2024-07-21 ENCOUNTER — Other Ambulatory Visit: Payer: Self-pay | Admitting: Bariatrics

## 2024-07-21 DIAGNOSIS — F5089 Other specified eating disorder: Secondary | ICD-10-CM

## 2024-07-22 ENCOUNTER — Other Ambulatory Visit: Payer: Self-pay | Admitting: Bariatrics

## 2024-07-22 DIAGNOSIS — F5089 Other specified eating disorder: Secondary | ICD-10-CM

## 2024-07-22 MED ORDER — BUPROPION HCL ER (SR) 200 MG PO TB12: 200.0000 mg | ORAL_TABLET | Freq: Every day | ORAL | 0 refills | Status: DC

## 2024-07-22 MED ORDER — TOPIRAMATE 25 MG PO TABS: 25.0000 mg | ORAL_TABLET | Freq: Two times a day (BID) | ORAL | 0 refills | Status: DC

## 2024-07-23 ENCOUNTER — Ambulatory Visit: Admitting: Bariatrics

## 2024-09-16 ENCOUNTER — Ambulatory Visit: Admitting: Bariatrics

## 2024-09-16 ENCOUNTER — Encounter: Payer: Self-pay | Admitting: Bariatrics

## 2024-09-16 VITALS — BP 137/77 | HR 83 | Ht 64.0 in | Wt 142.0 lb

## 2024-09-16 DIAGNOSIS — E669 Obesity, unspecified: Secondary | ICD-10-CM

## 2024-09-16 DIAGNOSIS — Z6824 Body mass index (BMI) 24.0-24.9, adult: Secondary | ICD-10-CM | POA: Diagnosis not present

## 2024-09-16 DIAGNOSIS — F5089 Other specified eating disorder: Secondary | ICD-10-CM

## 2024-09-16 DIAGNOSIS — I10 Essential (primary) hypertension: Secondary | ICD-10-CM | POA: Diagnosis not present

## 2024-09-16 MED ORDER — BUPROPION HCL ER (SR) 200 MG PO TB12: 200.0000 mg | ORAL_TABLET | Freq: Every day | ORAL | 1 refills | Status: AC

## 2024-09-16 MED ORDER — TOPIRAMATE 25 MG PO TABS: 25.0000 mg | ORAL_TABLET | Freq: Two times a day (BID) | ORAL | 1 refills | Status: AC

## 2024-09-16 NOTE — Progress Notes (Signed)
 WEIGHT SUMMARY AND BIOMETRICS  Weight Lost Since Last Visit: 0  Weight Gained Since Last Visit: 1lb   Vitals BP: 137/77 Pulse Rate: 83 SpO2: 98 %   Anthropometric Measurements Height: 5' 4 (1.626 m) Weight: 142 lb (64.4 kg) BMI (Calculated): 24.36 Weight at Last Visit: 141lb Weight Lost Since Last Visit: 0 Weight Gained Since Last Visit: 1lb Starting Weight: 239lb Total Weight Loss (lbs): 97 lb (44 kg)   Body Composition  Body Fat %: 42.8 % Fat Mass (lbs): 61.2 lbs Muscle Mass (lbs): 77.4 lbs Total Body Water (lbs): 65.4 lbs Visceral Fat Rating : 11   Other Clinical Data Fasting: no Labs: no Today's Visit #: 25 Starting Date: 09/16/20    OBESITY Cassandra Palmer is here to discuss her progress with her obesity treatment plan along with follow-up of her obesity related diagnoses.    Nutrition Plan: the Category 2 plan - 40% adherence.  Current exercise: water aerobics  Interim History:  She is up 1 lb and has been able to maintain her weight well. She is following the principles of the plan. Eating all of the food on the plan., Protein intake is as prescribed, Is not skipping meals, and Water intake is adequate.   Pharmacotherapy: Cassandra Palmer is not on any anti-obesity medications.  Hunger is moderately controlled.  Cravings are moderately controlled.  Assessment/Plan:   Eating disorder/emotional eating Cassandra Palmer has had issues with stress eating, emotional eating, and boredom eating. Currently this is moderately controlled. Overall mood is stable. Denies suicidal/homicidal ideation. Medication(s): Topamax  and Wellbutrin .   Plan:  Motivational interviewing as well as evidence-based interventions for health behavior change were utilized today including the discussion of self monitoring techniques, problem-solving barriers and SMART goal setting  techniques.  Medication(s): Wellbutrin  200 mg daily in the am (6 month supply) and Topamax  25 mg BID (6 month supply). Discussed distractions to curb eating behaviors. Discussed activities to do with one's hands in the evening  Be sure to get adequate rest as lack of rest can trigger appetite.  Have plan in place for stressful events.  Consider other rewards besides food.      Hypertension (Essential) Hypertension stable.  Medication(s): Coreg  3.125 mg twice daily   BP Readings from Last 3 Encounters:  09/16/24 137/77  04/17/24 117/76  02/07/24 (!) 142/62   Lab Results  Component Value Date   CREATININE 0.69 02/06/2024   CREATININE 0.69 11/08/2023   CREATININE 0.78 10/31/2022   No results found for: GFR  Plan: Continue all antihypertensives at current dosages. No added salt. Will keep sodium content to 1,500 mg or less per day.      Generalized Obesity: Current BMI BMI (Calculated): 24.36   Pharmacotherapy Plan: Continue Metformin per her PCP.    Cassandra Palmer is currently in the action stage of change. As such, her goal is to maintain weight for now.  She has agreed to the Category 2 plan.  Exercise goals: Older adults should determine their level of effort for physical activity relative to their level of fitness.   Behavioral modification strategies: decreasing simple carbohydrates , meal planning , increase water intake, better snacking choices, planning for success, increasing vegetables, avoiding temptations, measure portion sizes, and work on smaller portions.  Cassandra Palmer has agreed to follow-up with our clinic in 6 months. She will send me he most recent lab work.     Objective:   VITALS: Per patient if applicable, see vitals. GENERAL: Alert and in no acute distress. CARDIOPULMONARY: No increased WOB. Speaking in clear sentences.  PSYCH: Pleasant and cooperative. Speech normal rate and rhythm. Affect is appropriate. Insight and judgement are appropriate. Attention  is focused, linear, and appropriate.  NEURO: Oriented as arrived to appointment on time with no prompting.   Attestation Statements:   This was prepared with the assistance of Engineer, Civil (consulting).  Occasional wrong-word or sound-a-like substitutions may have occurred due to the inherent limitations of voice recognition   Cassandra Daring, DO

## 2024-10-13 DIAGNOSIS — E1169 Type 2 diabetes mellitus with other specified complication: Secondary | ICD-10-CM | POA: Diagnosis not present

## 2024-10-13 DIAGNOSIS — E782 Mixed hyperlipidemia: Secondary | ICD-10-CM | POA: Diagnosis not present

## 2024-10-13 DIAGNOSIS — E559 Vitamin D deficiency, unspecified: Secondary | ICD-10-CM | POA: Diagnosis not present

## 2024-10-13 DIAGNOSIS — I10 Essential (primary) hypertension: Secondary | ICD-10-CM | POA: Diagnosis not present

## 2024-10-13 DIAGNOSIS — E039 Hypothyroidism, unspecified: Secondary | ICD-10-CM | POA: Diagnosis not present

## 2024-10-16 DIAGNOSIS — I10 Essential (primary) hypertension: Secondary | ICD-10-CM | POA: Diagnosis not present

## 2024-10-16 DIAGNOSIS — E782 Mixed hyperlipidemia: Secondary | ICD-10-CM | POA: Diagnosis not present

## 2024-10-16 DIAGNOSIS — C9110 Chronic lymphocytic leukemia of B-cell type not having achieved remission: Secondary | ICD-10-CM | POA: Diagnosis not present

## 2024-10-16 DIAGNOSIS — G4700 Insomnia, unspecified: Secondary | ICD-10-CM | POA: Diagnosis not present

## 2024-10-16 DIAGNOSIS — J309 Allergic rhinitis, unspecified: Secondary | ICD-10-CM | POA: Diagnosis not present

## 2024-10-16 DIAGNOSIS — M549 Dorsalgia, unspecified: Secondary | ICD-10-CM | POA: Diagnosis not present

## 2024-10-16 DIAGNOSIS — M5430 Sciatica, unspecified side: Secondary | ICD-10-CM | POA: Diagnosis not present

## 2024-10-16 DIAGNOSIS — E039 Hypothyroidism, unspecified: Secondary | ICD-10-CM | POA: Diagnosis not present

## 2024-10-16 DIAGNOSIS — Z6826 Body mass index (BMI) 26.0-26.9, adult: Secondary | ICD-10-CM | POA: Diagnosis not present

## 2024-10-16 DIAGNOSIS — E1169 Type 2 diabetes mellitus with other specified complication: Secondary | ICD-10-CM | POA: Diagnosis not present

## 2024-10-16 DIAGNOSIS — Z0000 Encounter for general adult medical examination without abnormal findings: Secondary | ICD-10-CM | POA: Diagnosis not present

## 2024-11-11 ENCOUNTER — Other Ambulatory Visit: Payer: Self-pay

## 2024-11-11 DIAGNOSIS — D509 Iron deficiency anemia, unspecified: Secondary | ICD-10-CM

## 2024-11-11 DIAGNOSIS — D72820 Lymphocytosis (symptomatic): Secondary | ICD-10-CM

## 2024-11-12 ENCOUNTER — Inpatient Hospital Stay: Payer: Medicare HMO | Attending: Hematology

## 2024-11-12 ENCOUNTER — Inpatient Hospital Stay: Payer: Medicare HMO | Admitting: Hematology

## 2024-11-12 VITALS — BP 128/77 | HR 98 | Temp 97.3°F | Resp 18 | Wt 144.0 lb

## 2024-11-12 DIAGNOSIS — Z806 Family history of leukemia: Secondary | ICD-10-CM | POA: Diagnosis not present

## 2024-11-12 DIAGNOSIS — D72820 Lymphocytosis (symptomatic): Secondary | ICD-10-CM

## 2024-11-12 DIAGNOSIS — Z87891 Personal history of nicotine dependence: Secondary | ICD-10-CM | POA: Insufficient documentation

## 2024-11-12 DIAGNOSIS — D509 Iron deficiency anemia, unspecified: Secondary | ICD-10-CM

## 2024-11-12 DIAGNOSIS — C9110 Chronic lymphocytic leukemia of B-cell type not having achieved remission: Secondary | ICD-10-CM | POA: Diagnosis present

## 2024-11-12 DIAGNOSIS — Z79899 Other long term (current) drug therapy: Secondary | ICD-10-CM | POA: Diagnosis not present

## 2024-11-12 DIAGNOSIS — E538 Deficiency of other specified B group vitamins: Secondary | ICD-10-CM | POA: Diagnosis not present

## 2024-11-12 LAB — CMP (CANCER CENTER ONLY)
ALT: 13 U/L (ref 0–44)
AST: 17 U/L (ref 15–41)
Albumin: 4.6 g/dL (ref 3.5–5.0)
Alkaline Phosphatase: 72 U/L (ref 38–126)
Anion gap: 13 (ref 5–15)
BUN: 13 mg/dL (ref 8–23)
CO2: 24 mmol/L (ref 22–32)
Calcium: 10.2 mg/dL (ref 8.9–10.3)
Chloride: 102 mmol/L (ref 98–111)
Creatinine: 0.76 mg/dL (ref 0.44–1.00)
GFR, Estimated: >60 mL/min
Glucose, Bld: 216 mg/dL — ABNORMAL HIGH (ref 70–99)
Potassium: 4.1 mmol/L (ref 3.5–5.1)
Sodium: 139 mmol/L (ref 135–145)
Total Bilirubin: 0.4 mg/dL (ref 0.0–1.2)
Total Protein: 7 g/dL (ref 6.5–8.1)

## 2024-11-12 LAB — CBC WITH DIFFERENTIAL (CANCER CENTER ONLY)
Abs Immature Granulocytes: 0.01 K/uL (ref 0.00–0.07)
Basophils Absolute: 0.1 K/uL (ref 0.0–0.1)
Basophils Relative: 1 %
Eosinophils Absolute: 0.5 K/uL (ref 0.0–0.5)
Eosinophils Relative: 4 %
HCT: 42.4 % (ref 36.0–46.0)
Hemoglobin: 13.9 g/dL (ref 12.0–15.0)
Immature Granulocytes: 0 %
Lymphocytes Relative: 48 %
Lymphs Abs: 5.4 K/uL — ABNORMAL HIGH (ref 0.7–4.0)
MCH: 29.1 pg (ref 26.0–34.0)
MCHC: 32.8 g/dL (ref 30.0–36.0)
MCV: 88.7 fL (ref 80.0–100.0)
Monocytes Absolute: 0.7 K/uL (ref 0.1–1.0)
Monocytes Relative: 6 %
Neutro Abs: 4.8 K/uL (ref 1.7–7.7)
Neutrophils Relative %: 41 %
Platelet Count: 228 K/uL (ref 150–400)
RBC: 4.78 MIL/uL (ref 3.87–5.11)
RDW: 12.9 % (ref 11.5–15.5)
WBC Count: 11.5 K/uL — ABNORMAL HIGH (ref 4.0–10.5)
nRBC: 0 % (ref 0.0–0.2)

## 2024-11-12 LAB — IRON AND IRON BINDING CAPACITY (CC-WL,HP ONLY)
Iron: 89 ug/dL (ref 28–170)
Saturation Ratios: 23 % (ref 10.4–31.8)
TIBC: 388 ug/dL (ref 250–450)
UIBC: 299 ug/dL

## 2024-11-12 LAB — VITAMIN B12: Vitamin B-12: >4000 pg/mL — ABNORMAL HIGH (ref 180–914)

## 2024-11-12 LAB — LACTATE DEHYDROGENASE: LDH: 121 U/L (ref 105–235)

## 2024-11-12 LAB — FERRITIN: Ferritin: 28 ng/mL (ref 11–307)

## 2024-11-12 NOTE — Patient Instructions (Addendum)
 Cassandra Palmer

## 2024-11-12 NOTE — Progress Notes (Shared)
 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 female who has been referred to us  by Dr. Carlin Redbird Okey for evaluation and management of Leukocytosis. The pt reports that she is doing well overall. She has had elevated WBC's for the past several years. She notes that she has a hx of elevated leukocytes.   She notes that her blood sugar levels were mildly elevated due to a recent change in her medications due to her insurance not covering these medications. She has been on Metformin for the past 5 years. She has been on Victoza for 1.5 years. She has her labs checked regularly by her PCP. She denies any recent viral infections or flu like symptoms. She denies any recent illnesses within the past 5 years or recent abx use. She has never been on steroids or prednisone. She is UTD with her mammograms and colonoscopy (last one within the last 3 years).    The pt reports associated fatigue, joint stiffness every morning that alleviates with movement, recurrent night sweats at 4-5 AM (clammy sensation). She takes vitamin D  and she has had an elevated pulse rate that was evaluated with an echocardiogram with relief of her fatigue. She denies joint pain, abdominal pain, weight loss, mouth sores, and any other symptoms. She denies any other new symptoms in the past 6 months or year. She denies hx of rheumatological symptoms, thyroid  issues, blood transfusions, tattoos. She stopped taking Invokana approximately 1-1.5 years ago due to recurrent yeast infections.    Most recent lab results (07/03/18) of CBC w/diff and CMP is as follows: all values are WNL except for WBC at 11.3k, MCV at 80.8, MCH at 26.0,  Lymphs abs at 4.1k, Monocytes abs at 900, Glucose at 143.   On PMHx the pt reports Vitamin D  deficiency, Essential HTN, hyperlipidemia associated with DM Type 2, anemia, insomnia, Rosacea, seasonal allergies, and sciatic back pain radiating to right leg. On Social Hx the pt reports she used to smoke cigarettes in her 20's however, she hasn't smoked for the last 50 years. On Family Hx the pt reports Thyroid  issues involving her mother and sister. Her mother had a hx of CLL. She notes rheumatic fever on her father's side.    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    INTERVAL HISTORY: Cassandra Palmer is a 79 y.o. female who is here today for continued evaluation and management of CLL. she was last seen by me on 11/08/2023; at the time she did not have any concerns and was doing well.    Today, she says that she has been well over this past year. She notes having been ill last Spring. She reports she has been compliant with her B12 500 mcg daily. She says that she is up to date with age-recommended vaccinations.   Denies any new infection issues, fevers/chills, drenching night sweats, abdominal pain/distention, new bone pains, new lumps/bumps, change in breathing.  She notes that her blood pressure medication has changed. Also her PCP will be changing soon.  Reports that she enjoys reading in her free time.   REVIEW OF SYSTEMS:   10 Point review of systems of done and is negative except as noted above.  MEDICAL HISTORY Past Medical History:  Diagnosis Date   Anemia    Back pain    CLL (chronic lymphocytic leukemia) (HCC)    Diabetes mellitus type 2, uncontrolled    Edema of both lower extremities    Hyperlipidemia    Hypertension    Hypothyroidism    Joint pain    Lumbar spondylolysis    Obesity    Sciatic nerve disease    SOB (shortness of breath)    Vitamin D  deficiency     IMMUNIZATION HISTORY Immunization History   Administered Date(s) Administered   Fluzone Influenza virus vaccine,trivalent (IIV3), split virus 08/16/2012, 07/24/2014   Influenza Split 07/07/2010   Influenza-Unspecified 07/07/2013   PFIZER(Purple Top)SARS-COV-2 Vaccination 12/06/2019, 12/31/2019   Pfizer(Comirnaty)Fall Seasonal Vaccine 12 years and older 07/10/2023   Pneumococcal Conjugate-13 07/24/2014   Tdap 04/04/2011   Zoster, Live 02/09/2012    SURGICAL HISTORY Past Surgical History:  Procedure Laterality Date   ELBOW SURGERY     KNEE SURGERY     TONSILLECTOMY AND ADENOIDECTOMY      SOCIAL HISTORY Social History[1]  Social History   Social History Narrative   Not on file    SOCIAL DRIVERS OF HEALTH SDOH Screenings   Food Insecurity: No Food Insecurity (07/07/2022)  Transportation Needs: No Transportation Needs (07/07/2022)  Tobacco Use: Medium Risk (09/16/2024)     FAMILY HISTORY Family History  Problem Relation Age of Onset   Congestive Heart Failure Mother    Hypertension Mother    Thyroid  disease Mother    Cancer Mother    Anxiety disorder Mother    Diabetes Father    Heart Problems Father    Hyperlipidemia Father    Dementia Father    Thyroid  disease Father    Cancer Father    Congestive Heart Failure Maternal Grandmother    Heart attack Maternal Grandfather    Heart disease Paternal Grandmother      ALLERGIES: is allergic to invokana [canagliflozin], actos [pioglitazone], and tinactin foot-sneaker deodor [podiatric products].  MEDICATIONS  Current Outpatient Medications  Medication Sig Dispense Refill   buPROPion  (WELLBUTRIN  SR) 200 MG 12 hr tablet Take 1 tablet (200 mg total) by mouth daily. 90 tablet 1   celecoxib (CELEBREX) 200 MG capsule Take 200 mg by mouth daily.     cetirizine (ZYRTEC) 10 MG tablet Take 10 mg by mouth daily.     cholecalciferol (VITAMIN D3) 25 MCG (1000 UNIT) tablet Take 1,000 Units by mouth daily.     cyclobenzaprine (FLEXERIL) 10 MG tablet Take 10 mg by mouth  daily as needed for muscle spasms.     ezetimibe-simvastatin (VYTORIN) 10-40 MG tablet Take 1 tablet by mouth daily.     furosemide (LASIX) 40 MG tablet Take 40 mg by mouth.     gabapentin (NEURONTIN) 300 MG capsule Take 300 mg by mouth as needed.     iron  polysaccharides (NIFEREX) 150 MG capsule Take 1 capsule (150 mg total) by mouth daily. 30 capsule 5   levothyroxine (SYNTHROID) 112 MCG tablet Take 112 mcg by mouth daily before breakfast.     metFORMIN (GLUCOPHAGE) 1000 MG tablet Take 1,000 mg by mouth 2 (two) times daily with a meal.     metroNIDAZOLE (METROCREAM) 0.75 % cream Apply 1 application topically as directed.     montelukast (SINGULAIR) 10 MG tablet Take 10 mg by mouth at bedtime.     Multiple Vitamin (MULTIVITAMIN) tablet Take 1 tablet by mouth daily.     niacin (NIASPAN) 1000  MG CR tablet Take 500 mg by mouth at bedtime. 500-100 mg     OZEMPIC, 2 MG/DOSE, 8 MG/3ML SOPN Inject 2 mg into the skin once a week.     topiramate  (TOPAMAX ) 25 MG tablet Take 1 tablet (25 mg total) by mouth 2 (two) times daily. 180 tablet 1   traZODone (DESYREL) 100 MG tablet Take 100 mg by mouth at bedtime as needed for sleep.     valsartan (DIOVAN) 40 MG tablet Take 40 mg by mouth daily.     vitamin B-12 (CYANOCOBALAMIN ) 500 MCG tablet Take 500 mcg by mouth daily.     carvedilol  (COREG ) 3.125 MG tablet Take 1 tablet (3.125 mg total) by mouth 2 (two) times daily. (Patient not taking: Reported on 11/12/2024) 180 tablet 3   glucose blood test strip 1 each by Other route as needed for other. Use as instructed (Patient not taking: Reported on 09/16/2024)     No current facility-administered medications for this visit.    PHYSICAL EXAMINATION: ECOG PERFORMANCE STATUS: 0 - Asymptomatic VITALS: Vitals:   11/12/24 1018  BP: 128/77  Pulse: 98  Resp: 18  Temp: (!) 97.3 F (36.3 C)  SpO2: 100%   Filed Weights   11/12/24 1018  Weight: 144 lb (65.3 kg)   Body mass index is 24.72 kg/m.  GENERAL:  alert, in no acute distress and comfortable SKIN: no acute rashes, no significant lesions EYES: conjunctiva are pink and non-injected, sclera anicteric OROPHARYNX: MMM, no exudates, no oropharyngeal erythema or ulceration NECK: supple, no JVD LYMPH:  no palpable lymphadenopathy in the cervical, axillary or inguinal regions LUNGS: clear to auscultation b/l with normal respiratory effort HEART: regular rate & rhythm ABDOMEN:  normoactive bowel sounds , non tender, not distended, no hepatosplenomegaly Extremity: no pedal edema PSYCH: alert & oriented x 3 with fluent speech NEURO: no focal motor/sensory deficits  LABORATORY DATA:   I have reviewed the data as listed     Latest Ref Rng & Units 11/12/2024    9:44 AM 02/06/2024    9:03 PM 11/08/2023    9:15 AM  CBC EXTENDED  WBC 4.0 - 10.5 K/uL 11.5  32.4  12.4   RBC 3.87 - 5.11 MIL/uL 4.78  4.71  4.26   Hemoglobin 12.0 - 15.0 g/dL 86.0  86.0  87.3   HCT 36.0 - 46.0 % 42.4  41.6  38.2   Platelets 150 - 400 K/uL 228  310  209   NEUT# 1.7 - 7.7 K/uL 4.8   5.3   Lymph# 0.7 - 4.0 K/uL 5.4   5.8    Lab Results  Component Value Date   LDH 121 11/12/2024   LDH 102 11/08/2023   IRON  STUDIES Lab Results  Component Value Date   IRON  89 11/12/2024   IRON  59 11/08/2023    Lab Results  Component Value Date   UIBC 299 11/12/2024   UIBC 332 11/08/2023    Lab Results  Component Value Date   TIBC 388 11/12/2024   TIBC 391 11/08/2023    Lab Results  Component Value Date   IRONPCTSAT 23 11/12/2024   IRONPCTSAT 15 11/08/2023    Lab Results  Component Value Date   FERRITIN 28 11/12/2024   FERRITIN 15 11/08/2023     Lab Results  Component Value Date   LDH 121 11/12/2024   LDH 102 11/08/2023   Lab Results  Component Value Date   VITAMINB12 >4,000 (H) 11/12/2024   VITAMINB12 5,708 (H) 11/08/2023  Latest Ref Rng & Units 11/12/2024    9:44 AM 02/06/2024    9:03 PM 11/08/2023    9:15 AM  CMP  Glucose 70 - 99 mg/dL 783  806  814    BUN 8 - 23 mg/dL 13  29  18    Creatinine 0.44 - 1.00 mg/dL 9.23  9.30  9.30   Sodium 135 - 145 mmol/L 139  136  139   Potassium 3.5 - 5.1 mmol/L 4.1  3.9  3.8   Chloride 98 - 111 mmol/L 102  99  105   CO2 22 - 32 mmol/L 24  22  27    Calcium 8.9 - 10.3 mg/dL 89.7  9.4  9.6   Total Protein 6.5 - 8.1 g/dL 7.0  6.5  6.6   Total Bilirubin 0.0 - 1.2 mg/dL 0.4  0.6  0.4   Alkaline Phos 38 - 126 U/L 72  40  51   AST 15 - 41 U/L 17  15  13    ALT 0 - 44 U/L 13  14  11     PREVIOUS STUDIES:  Component     Latest Ref Rng & Units 08/05/2018  Hep B Core Ab, Tot     Negative Negative  Hepatitis B Surface Ag     Negative Negative  HCV Ab     0.0 - 0.9 s/co ratio <0.1  LDH     98 - 192 U/L 175    09/09/18 CLL FISH:         07/03/18 CBC w/diff:     RADIOGRAPHIC STUDIES: I have personally reviewed the radiological images as listed and agreed with the findings in the report. No results found.  ASSESSMENT & PLAN:  79 y.o. female with  1.  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  08/05/18 Peripheral Blood Flow cytometry revealed CD5 positive monoclonal B-cell population at comprising 49% of total lymphocytes   08/05/18 Hep B and Hep C serologies neg    09/09/18 FISH CLL Prognostic panel revealed 17% of cells with mono-allelic 13q deletion, 34% of cells with bi-allelic 13q deletion, and 49% cells with normal nuclei.    09/18/18 PET/CT revealed Focal hypermetabolism in the proximal right femoral metaphysis without discrete bone lesion on the CT images, suspicious for osseous lymphoma. 2. No additional metabolic evidence of lymphoma. No hypermetabolic lymphadenopathy. Normal size non-hypermetabolic spleen. 3. Few scattered solid pulmonary nodules, largest 7 mm, below PET resolution. Recommend attention on follow-up chest CT in 3-6 months. 4. Chronic findings include: Aortic Atherosclerosis. Coronary atherosclerosis. Small hiatal  hernia. Moderate colonic diverticulosis. Cholelithiasis. Myomatous uterus.    10/12/18 MRI Femur Right revealed Signal in the region of increased activity on PET scan correlates with red marrow but is subtly more focal and decreased on T1 weighted imaging compared to other imaged bones. Given activity on PET, the finding is worrisome for focal lymphoma involvement.   07/15/2019 CT chest wo contrast revealed 1. Multiple small pulmonary nodules measuring 3-4 mm in size, nonspecific, but favored to be benign. No follow-up needed if patient is low-risk (and has no known or suspected primary neoplasm). Non-contrast chest CT can be considered in 12 months if patient is high-risk. This recommendation follows the consensus statement: Guidelines for Management of Incidental Pulmonary NodulesDetected on CT Images: From the Fleischner Society 2017; Radiology 2017; 284:228-243. 2. Aortic atherosclerosis, in addition to left anterior descending coronary artery disease. Please note that although the presence of coronary  artery calcium documents the presence of coronary artery disease, the severity of this disease and any potential stenosis cannot be assessed on this non-gated CT examination. Assessment for potential risk factor modification, dietary therapy or pharmacologic therapy may be warranted, if clinically indicated. 3. Small hiatal hernia.   07/15/2019 DG femur which revealed Faint lucency in the proximal right femoral shaft which corresponds to the lesion seen on prior MRI and PET-CT.   #2 RBC microcytosis without anemia related to iron  deficiency She has been on chronic NSAIDs with the risk of NSAID related gastropathy and bleeding. IRON  STUDIES Lab Results  Component Value Date   IRON  89 11/12/2024   IRON  59 11/08/2023    Lab Results  Component Value Date   UIBC 299 11/12/2024   UIBC 332 11/08/2023    Lab Results  Component Value Date   TIBC 388 11/12/2024   TIBC 391 11/08/2023    Lab  Results  Component Value Date   IRONPCTSAT 23 11/12/2024   IRONPCTSAT 15 11/08/2023    Lab Results  Component Value Date   FERRITIN 28 11/12/2024   FERRITIN 15 11/08/2023     #3 vitamin B12 deficiency Could be an absorption issue or could be from metformin Lab Results  Component Value Date   VITAMINB12 >4,000 (H) 11/12/2024   VITAMINB12 5,708 (H) 11/08/2023   PLAN: - Discussed lab results on 11/12/2024 in detail with patient: CBC showed WBC of 11.5K decreased from 32.4K, Hemoglobin of 13.9, and PLTs of 228K. CMP with Glucose 216.  Iron  Panel and Ferritin: Iron % 23 and Ferritin 28 LDH 121  Vitamin B12: >4K   Continue Vitamin B12 supplementation.   FOLLOW-UP in 12 months for labs and follow-up with Dr. Onesimo.  The total time spent in the appointment was *** minutes* .  All of the patient's questions were answered and the patient knows to call the clinic with any problems, questions, or concerns.  Emaline Onesimo MD MS AAHIVMS [facility] Hematology/Oncology Physician [facility]  *Total Encounter Time as defined by the Centers for Medicare and Medicaid Services includes, in addition to the face-to-face time of a patient visit (documented in the note above) non-face-to-face time: obtaining and reviewing outside history, ordering and reviewing medications, tests or procedures, care coordination (communications with other health care professionals or caregivers) and documentation in the medical record.  I,Emily Lagle,acting as a neurosurgeon for Emaline Onesimo, MD.,have documented all relevant documentation on the behalf of Emaline Onesimo, MD,as directed by  Emaline Onesimo, MD while in the presence of Emaline Onesimo, MD.  I have reviewed the above documentation for accuracy and completeness, and I agree with the above.  Emaline Onesimo, MD     [1]  Social History Tobacco Use   Smoking status: Former   Smokeless tobacco: Never  Vaping Use   Vaping status: Never Used  Substance Use Topics    Alcohol use: Yes    Alcohol/week: 1.0 - 2.0 standard drink of alcohol    Types: 1 - 2 Glasses of wine per week    Comment: Occasional   Drug use: No   "

## 2024-11-13 ENCOUNTER — Encounter: Payer: Self-pay | Admitting: Family Medicine

## 2025-03-17 ENCOUNTER — Ambulatory Visit: Admitting: Bariatrics

## 2025-12-16 ENCOUNTER — Inpatient Hospital Stay: Admitting: Hematology

## 2025-12-16 ENCOUNTER — Inpatient Hospital Stay
